# Patient Record
Sex: Male | Born: 1935
Health system: Southern US, Community
[De-identification: ages and names within clinical notes are randomized; demographics above are authoritative.]

## PROBLEM LIST (undated history)

## (undated) DIAGNOSIS — I700 Atherosclerosis of aorta: Secondary | ICD-10-CM

## (undated) DIAGNOSIS — K449 Diaphragmatic hernia without obstruction or gangrene: Secondary | ICD-10-CM

## (undated) DIAGNOSIS — K760 Fatty (change of) liver, not elsewhere classified: Secondary | ICD-10-CM

## (undated) DIAGNOSIS — F329 Major depressive disorder, single episode, unspecified: Secondary | ICD-10-CM

## (undated) DIAGNOSIS — K429 Umbilical hernia without obstruction or gangrene: Secondary | ICD-10-CM

## (undated) DIAGNOSIS — N529 Male erectile dysfunction, unspecified: Secondary | ICD-10-CM

## (undated) DIAGNOSIS — T7840XA Allergy, unspecified, initial encounter: Secondary | ICD-10-CM

## (undated) DIAGNOSIS — I340 Nonrheumatic mitral (valve) insufficiency: Secondary | ICD-10-CM

## (undated) DIAGNOSIS — M549 Dorsalgia, unspecified: Secondary | ICD-10-CM

## (undated) DIAGNOSIS — M1990 Unspecified osteoarthritis, unspecified site: Secondary | ICD-10-CM

## (undated) DIAGNOSIS — K219 Gastro-esophageal reflux disease without esophagitis: Secondary | ICD-10-CM

## (undated) DIAGNOSIS — E119 Type 2 diabetes mellitus without complications: Secondary | ICD-10-CM

## (undated) DIAGNOSIS — M4800 Spinal stenosis, site unspecified: Secondary | ICD-10-CM

## (undated) DIAGNOSIS — I252 Old myocardial infarction: Secondary | ICD-10-CM

## (undated) DIAGNOSIS — I2510 Atherosclerotic heart disease of native coronary artery without angina pectoris: Secondary | ICD-10-CM

## (undated) DIAGNOSIS — F32A Depression, unspecified: Secondary | ICD-10-CM

## (undated) DIAGNOSIS — I517 Cardiomegaly: Secondary | ICD-10-CM

## (undated) DIAGNOSIS — I10 Essential (primary) hypertension: Secondary | ICD-10-CM

## (undated) DIAGNOSIS — G8929 Other chronic pain: Secondary | ICD-10-CM

## (undated) HISTORY — DX: Nonrheumatic mitral (valve) insufficiency: I34.0

## (undated) HISTORY — PX: COLONOSCOPY WITH ESOPHAGOGASTRODUODENOSCOPY (EGD): SHX5779

## (undated) HISTORY — DX: Other chronic pain: G89.29

## (undated) HISTORY — DX: Umbilical hernia without obstruction or gangrene: K42.9

## (undated) HISTORY — DX: Depression, unspecified: F32.A

## (undated) HISTORY — DX: Atherosclerotic heart disease of native coronary artery without angina pectoris: I25.10

## (undated) HISTORY — DX: Old myocardial infarction: I25.2

## (undated) HISTORY — PX: KNEE CARTILAGE SURGERY: SHX688

## (undated) HISTORY — DX: Male erectile dysfunction, unspecified: N52.9

## (undated) HISTORY — DX: Atherosclerosis of aorta: I70.0

## (undated) HISTORY — DX: Gastro-esophageal reflux disease without esophagitis: K21.9

## (undated) HISTORY — DX: Other chronic pain: M54.9

## (undated) HISTORY — PX: CHOLECYSTECTOMY: SHX55

## (undated) HISTORY — DX: Cardiomegaly: I51.7

## (undated) HISTORY — DX: Major depressive disorder, single episode, unspecified: F32.9

## (undated) HISTORY — DX: Spinal stenosis, site unspecified: M48.00

## (undated) HISTORY — PX: ERCP: SHX5425

## (undated) HISTORY — DX: Diaphragmatic hernia without obstruction or gangrene: K44.9

## (undated) HISTORY — DX: Fatty (change of) liver, not elsewhere classified: K76.0

## (undated) HISTORY — DX: Unspecified osteoarthritis, unspecified site: M19.90

## (undated) HISTORY — DX: Allergy, unspecified, initial encounter: T78.40XA

---

## 2008-04-20 ENCOUNTER — Ambulatory Visit (HOSPITAL_COMMUNITY): Admission: RE | Admit: 2008-04-20 | Discharge: 2008-04-21 | Payer: Self-pay | Admitting: Ophthalmology

## 2008-04-20 ENCOUNTER — Encounter (INDEPENDENT_AMBULATORY_CARE_PROVIDER_SITE_OTHER): Payer: Self-pay | Admitting: Ophthalmology

## 2010-09-17 NOTE — Op Note (Signed)
NAME:  Benjamin Ramirez, Benjamin Ramirez                  ACCOUNT NO.:  [id number]   MEDICAL RECORD NO.:  [id number]          PATIENT TYPE:  OIB   LOCATION:  5120                         FACILITY:  [facility]   PHYSICIAN:  Beulah Gandy. Ashley Royalty, M.D. DATE OF BIRTH:  02/04/1935   DATE OF PROCEDURE:  04/20/2008  DATE OF DISCHARGE:                               OPERATIVE REPORT   PROCEDURES:  Pars plana vitrectomy right eye, removal of intraocular  lens from vitreous retinal photocoagulation, placement of secondary  intraocular lens with suture and gas fluid exchange right eye.   SURGEON:  Beulah Gandy. Ashley Royalty, MD   ASSISTANT:  Rosalie Doctor, MA   ANESTHESIA:  General.   DETAILS:  Usual prep and drape, conjunctival peritomy from 8 o'clock  around to 4 o'clock, two scleral flaps were raised at 3 and 9 o'clock in  anticipation of IOL suture.  A posterior scleral wound was placed from  10 o'clock to 2 o'clock.  25-gauge cannulas were placed at 8, 10 and 2  o'clock.  Infusion at 8 o'clock.  A preplaced 9-0 suture was placed in  the corneoscleral wound at 12 o'clock.  Pars plana vitrectomy was begun  just behind the pupillary axis.  Pigment and vitreous was encountered  and this was carefully removed under low suction and rapid cutting.  Intraocular lens came into view.  This was cleaned and the vitreous was  removed from its attachments to the intraocular lens.  The vitrectomy  was carried posteriorly and a core vitrectomy was performed.  Then the  vitrectomy was carried into the far periphery where additional  vitrectomy was carried down to the vitreous base for 360 degrees.  Intraocular lens was then passed into the anterior chamber and the  corneoscleral wound was opened entirely.  The intraocular lens was  removed from the eye and dialed out of the corneal wound.  9-0 preplaced  sutures were placed back in the corneal wound and additional vitrectomy  was completed.  Two Prolene sutures were passed from 3 o'clock to 9  o'clock for IOL suture.  These sutures were externalized through the  corneal wound.  A new intraocular lens model [id number] [employer]. power 20.5 D, length 12.5 MM, optic 7.0 MM, serial number [id number]  007, expiration date December 2013 was brought onto the field.  It was  inspected and cleaned.  The Prolene sutures were attached to the eyelets  of the lens and the lens was passed into the anterior chamber, then into  the posterior chamber.  It was dialed into place.  The Prolene sutures  were drawn securely and knotted beneath the scleral flaps.  Scleral  flaps were allowed to cover the knots.  The corneal wound was closed  with six interrupted 10-0 nylon sutures.  Additional vitrectomy was  performed to remove pigment from the vitreous.  The retina itself was  cleaned and vacuumed with a vitreous cutter.  A 30% gas fluid exchange  was performed.  The instruments were removed from the eye.  The trocars  were removed from the eye.  Closing  pressure was 15 with a [place]  tonometer and the indirect ophthalmoscope laser was moved into place and  two rows of laser were placed around the retinal periphery.  The power  was 2000 milliwatts, 596 burns, 1000 microns each and 0.1 seconds each.  The  conjunctiva was reapproximated with 7-0 chromic suture.  Polymyxin and  gentamicin were irrigated into [place] space.  Decadron 10 mg was injected  into the lower subconjunctival space.  Polysporin ophthalmic ointment,  patch and shield were placed.  The patient was awakened and taken to  recovery in satisfactory condition.      Beulah Gandy. Ashley Royalty, M.D.  Electronically Signed     JDM/MEDQ  D:  04/20/2008  T:  04/21/2008  Job:  [id number]

## 2011-02-07 LAB — GLUCOSE, CAPILLARY
Glucose-Capillary: 107 mg/dL — ABNORMAL HIGH (ref 70–99)
Glucose-Capillary: 168 mg/dL — ABNORMAL HIGH (ref 70–99)
Glucose-Capillary: 173 mg/dL — ABNORMAL HIGH (ref 70–99)
Glucose-Capillary: 175 mg/dL — ABNORMAL HIGH (ref 70–99)

## 2011-02-07 LAB — COMPREHENSIVE METABOLIC PANEL
ALT: 21 U/L (ref 0–53)
Alkaline Phosphatase: 59 U/L (ref 39–117)
BUN: 12 mg/dL (ref 6–23)
CO2: 27 mEq/L (ref 19–32)
Chloride: 103 mEq/L (ref 96–112)
GFR calc non Af Amer: >60 mL/min (ref >60)
Glucose, Bld: 131 mg/dL — ABNORMAL HIGH (ref 70–99)
Potassium: 4.6 mEq/L (ref 3.5–5.1)
Sodium: 138 mEq/L (ref 135–145)
Total Bilirubin: 0.8 mg/dL (ref 0.3–1.2)

## 2011-02-07 LAB — CBC
HCT: 46.3 % (ref 39.0–52.0)
Hemoglobin: 15.5 g/dL (ref 13.0–17.0)
RBC: 5.16 MIL/uL (ref 4.22–5.81)

## 2011-09-22 DIAGNOSIS — Z9889 Other specified postprocedural states: Secondary | ICD-10-CM | POA: Insufficient documentation

## 2011-10-06 DIAGNOSIS — I219 Acute myocardial infarction, unspecified: Secondary | ICD-10-CM | POA: Insufficient documentation

## 2011-10-06 DIAGNOSIS — K449 Diaphragmatic hernia without obstruction or gangrene: Secondary | ICD-10-CM

## 2011-10-06 DIAGNOSIS — K219 Gastro-esophageal reflux disease without esophagitis: Secondary | ICD-10-CM | POA: Insufficient documentation

## 2011-10-06 DIAGNOSIS — J302 Other seasonal allergic rhinitis: Secondary | ICD-10-CM | POA: Insufficient documentation

## 2011-10-06 DIAGNOSIS — J329 Chronic sinusitis, unspecified: Secondary | ICD-10-CM | POA: Insufficient documentation

## 2011-10-06 DIAGNOSIS — N529 Male erectile dysfunction, unspecified: Secondary | ICD-10-CM | POA: Insufficient documentation

## 2011-10-06 DIAGNOSIS — M1990 Unspecified osteoarthritis, unspecified site: Secondary | ICD-10-CM

## 2011-10-06 DIAGNOSIS — M4690 Unspecified inflammatory spondylopathy, site unspecified: Secondary | ICD-10-CM | POA: Insufficient documentation

## 2011-10-06 HISTORY — DX: Diaphragmatic hernia without obstruction or gangrene: K44.9

## 2011-10-06 HISTORY — DX: Male erectile dysfunction, unspecified: N52.9

## 2011-10-06 HISTORY — DX: Unspecified osteoarthritis, unspecified site: M19.90

## 2014-07-26 DIAGNOSIS — E1149 Type 2 diabetes mellitus with other diabetic neurological complication: Secondary | ICD-10-CM | POA: Insufficient documentation

## 2015-02-14 ENCOUNTER — Emergency Department (HOSPITAL_BASED_OUTPATIENT_CLINIC_OR_DEPARTMENT_OTHER)
Admission: EM | Admit: 2015-02-14 | Discharge: 2015-02-14 | Disposition: A | Discharge status: Home / Self Care | Payer: Medicare (Managed Care) | Attending: Emergency Medicine | Admitting: Emergency Medicine

## 2015-02-14 ENCOUNTER — Emergency Department (HOSPITAL_BASED_OUTPATIENT_CLINIC_OR_DEPARTMENT_OTHER): Payer: Medicare (Managed Care)

## 2015-02-14 ENCOUNTER — Encounter (HOSPITAL_BASED_OUTPATIENT_CLINIC_OR_DEPARTMENT_OTHER): Payer: Self-pay | Admitting: *Deleted

## 2015-02-14 DIAGNOSIS — M25562 Pain in left knee: Secondary | ICD-10-CM | POA: Diagnosis present

## 2015-02-14 DIAGNOSIS — M1712 Unilateral primary osteoarthritis, left knee: Secondary | ICD-10-CM

## 2015-02-14 DIAGNOSIS — M25462 Effusion, left knee: Secondary | ICD-10-CM | POA: Insufficient documentation

## 2015-02-14 DIAGNOSIS — Z7984 Long term (current) use of oral hypoglycemic drugs: Secondary | ICD-10-CM | POA: Diagnosis not present

## 2015-02-14 DIAGNOSIS — E119 Type 2 diabetes mellitus without complications: Secondary | ICD-10-CM | POA: Insufficient documentation

## 2015-02-14 DIAGNOSIS — I10 Essential (primary) hypertension: Secondary | ICD-10-CM | POA: Insufficient documentation

## 2015-02-14 DIAGNOSIS — Z79899 Other long term (current) drug therapy: Secondary | ICD-10-CM | POA: Insufficient documentation

## 2015-02-14 HISTORY — DX: Type 2 diabetes mellitus without complications: E11.9

## 2015-02-14 HISTORY — DX: Essential (primary) hypertension: I10

## 2015-02-14 LAB — CBC WITH DIFFERENTIAL/PLATELET
BASOS PCT: 1 %
Basophils Absolute: 0 10*3/uL (ref 0.0–0.1)
Eosinophils Absolute: 0 10*3/uL (ref 0.0–0.7)
Eosinophils Relative: 1 %
HEMATOCRIT: 46.5 % (ref 39.0–52.0)
HEMOGLOBIN: 16.2 g/dL (ref 13.0–17.0)
LYMPHS PCT: 11 %
Lymphs Abs: 0.9 10*3/uL (ref 0.7–4.0)
MCH: 30.1 pg (ref 26.0–34.0)
MCHC: 34.8 g/dL (ref 30.0–36.0)
MCV: 86.4 fL (ref 78.0–100.0)
MONO ABS: 0.4 10*3/uL (ref 0.1–1.0)
MONOS PCT: 5 %
NEUTROS ABS: 6.3 10*3/uL (ref 1.7–7.7)
Neutrophils Relative %: 82 %
Platelets: 183 10*3/uL (ref 150–400)
RBC: 5.38 MIL/uL (ref 4.22–5.81)
RDW: 13.7 % (ref 11.5–15.5)
WBC: 7.6 10*3/uL (ref 4.0–10.5)

## 2015-02-14 LAB — CBG MONITORING, ED: Glucose-Capillary: 107 mg/dL — ABNORMAL HIGH (ref 65–99)

## 2015-02-14 LAB — BASIC METABOLIC PANEL
ANION GAP: 8 (ref 5–15)
BUN: 14 mg/dL (ref 6–20)
CHLORIDE: 106 mmol/L (ref 101–111)
CO2: 24 mmol/L (ref 22–32)
CREATININE: 0.92 mg/dL (ref 0.61–1.24)
Calcium: 9.4 mg/dL (ref 8.9–10.3)
GFR calc non Af Amer: >60 mL/min (ref >60)
GLUCOSE: 120 mg/dL — AB (ref 65–99)
Potassium: 3.6 mmol/L (ref 3.5–5.1)
Sodium: 138 mmol/L (ref 135–145)

## 2015-02-14 MED ORDER — HYDROMORPHONE HCL 1 MG/ML IJ SOLN
1.0000 mg | Freq: Once | INTRAMUSCULAR | Status: AC
  Administered 2015-02-14: 1 mg via INTRAVENOUS
  Filled 2015-02-14: qty 1

## 2015-02-14 NOTE — ED Notes (Signed)
Pt arrived via [facility] with c/o left knee swelling today. Pt had surgury for torn cartilage in 1984. No recent injury. Pt took oxycodone 30 mg prior to EMS arrival. Swelling noted to left knee. Ice pack applied.

## 2015-02-14 NOTE — ED Notes (Signed)
Pt states he has friends at home to assist with him getting into the house. His girlfriend will drive him.

## 2015-02-14 NOTE — ED Provider Notes (Signed)
CSN: [id number]     Arrival date & time 02/14/15  1453 History   First MD Initiated Contact with Patient 02/14/15 1527     No chief complaint on file.    (Consider location/radiation/quality/duration/timing/severity/associated sxs/prior Treatment) The history is provided by the patient.  Benjamin Ramirez is a 80 y.o. male hx of DM, HTN arthritis here presenting with left knee swelling and pain. Patient states that his left knee has been intermittently swollen but worse over the last week or so. He had a meniscus repair in 1984 by a doctor in [place]. States that his orthopedic doctor has retired. Patient has been taking oxycodone as prescribed by his doctor for pain. Denies any fevers or chills. States that he has some pain when he puts weight on it. Denies any history of gout.   Past Medical History  Diagnosis Date  . Diabetes mellitus without complication (HCC)   . Hypertension    History reviewed. No pertinent past surgical history. No family history on file. Social History  Substance Use Topics  . Smoking status: Never Smoker   . Smokeless tobacco: None  . Alcohol Use: None    Review of Systems  Musculoskeletal:       L knee pain   All other systems reviewed and are negative.     Allergies  Novocain  Home Medications   Prior to Admission medications   Medication Sig Start Date End Date Taking? Authorizing Provider  amLODipine (NORVASC) 10 MG tablet Take 10 mg by mouth daily.   Yes Historical Provider, MD  metFORMIN (GLUCOPHAGE) 500 MG tablet Take by mouth 2 (two) times daily with a meal.   Yes Historical Provider, MD  oxyCODONE (ROXICODONE) 15 MG immediate release tablet Take 15 mg by mouth every 4 (four) hours as needed for pain.   Yes Historical Provider, MD   BP 147/71 mmHg  Pulse 60  Temp(Src) 98.4 F (36.9 C) (Oral)  Resp 18  SpO2 100% Physical Exam  Constitutional: He is oriented to person, place, and time.  Uncomfortable   HENT:  Head: Normocephalic.   Eyes: Pupils are equal, round, and reactive to light.  Neck: Normal range of motion.  Cardiovascular: Normal rate.   Pulmonary/Chest: Effort normal.  Abdominal: Soft. Bowel sounds are normal.  Musculoskeletal:  L knee with obvious effusion, dec ROM. Knee is not warm or red. 2+ pulses.   Neurological: He is alert and oriented to person, place, and time.  Skin: Skin is warm.  Psychiatric: He has a normal mood and affect. His behavior is normal. Judgment and thought content normal.  Nursing note and vitals reviewed.   ED Course  Procedures (including critical care time) Labs Review Labs Reviewed  BASIC METABOLIC PANEL - Abnormal; Notable for the following:    Glucose, Bld 120 (*)    All other components within normal limits  CBG MONITORING, ED - Abnormal; Notable for the following:    Glucose-Capillary 107 (*)    All other components within normal limits  CBC WITH DIFFERENTIAL/PLATELET    Imaging Review Dg Knee Complete 4 Views Left  02/14/2015  CLINICAL DATA:  Sudden onset of left knee pain, redness and swelling. EXAM: LEFT KNEE - COMPLETE 4+ VIEW COMPARISON:  None. FINDINGS: There is a large joint effusion. There is severe tricompartmental osteoarthritis. Degenerative calcifications in the distal quadriceps tendon and proximal patellar tendon. Loose bodies in the joint. No discrete fracture. IMPRESSION: Tricompartmental osteoarthritis.  Large joint effusion. Electronically Signed   By: Fayrene Fearing  Maxwell M.D.   On: 02/14/2015 16:40   I have personally reviewed and evaluated these images and lab results as part of my medical decision-making.   EKG Interpretation None      MDM   Final diagnoses:  None   Benjamin Ramirez is a 80 y.o. male here with L knee effusion. Xray showed arthritis. Effusion likely reactive from the arthritis. No hx of gout. No signs of septic knee. Has pain meds at home. Given dilaudid with some relief. Given knee immobilizer and crutches. Recommend ortho f/u for  possible steroid injections vs knee replacement.     Richardean Canal, MD 02/14/15 [phone]

## 2015-02-14 NOTE — ED Notes (Signed)
Talked with Dr Silverio Lay about giving pain med prior to discharge.  Dr Silverio Lay states okay to discharge pt after pain med.

## 2015-02-14 NOTE — Discharge Instructions (Signed)
Continue oxycodone as prescribed by your doctor.   Keep leg elevated. Use crutches for support.  See orthopedic doctor. You have bad arthritis and should consider steroid injections or knee replacement.   Return to ER if you have worse pain or swelling, fevers.

## 2017-05-29 ENCOUNTER — Ambulatory Visit (INDEPENDENT_AMBULATORY_CARE_PROVIDER_SITE_OTHER): Payer: Medicare HMO | Admitting: Physician Assistant

## 2017-05-29 ENCOUNTER — Encounter: Payer: Self-pay | Admitting: Physician Assistant

## 2017-05-29 VITALS — BP 121/53 | HR 61 | Wt 156.0 lb

## 2017-05-29 DIAGNOSIS — M545 Low back pain, unspecified: Secondary | ICD-10-CM

## 2017-05-29 DIAGNOSIS — Z79899 Other long term (current) drug therapy: Secondary | ICD-10-CM

## 2017-05-29 DIAGNOSIS — Z130 Encounter for screening for diseases of the blood and blood-forming organs and certain disorders involving the immune mechanism: Secondary | ICD-10-CM

## 2017-05-29 DIAGNOSIS — Z13220 Encounter for screening for lipoid disorders: Secondary | ICD-10-CM

## 2017-05-29 DIAGNOSIS — E1159 Type 2 diabetes mellitus with other circulatory complications: Secondary | ICD-10-CM | POA: Insufficient documentation

## 2017-05-29 DIAGNOSIS — K5903 Drug induced constipation: Secondary | ICD-10-CM | POA: Insufficient documentation

## 2017-05-29 DIAGNOSIS — E1140 Type 2 diabetes mellitus with diabetic neuropathy, unspecified: Secondary | ICD-10-CM

## 2017-05-29 DIAGNOSIS — Z23 Encounter for immunization: Secondary | ICD-10-CM | POA: Diagnosis not present

## 2017-05-29 DIAGNOSIS — Z7689 Persons encountering health services in other specified circumstances: Secondary | ICD-10-CM | POA: Diagnosis not present

## 2017-05-29 DIAGNOSIS — M4800 Spinal stenosis, site unspecified: Secondary | ICD-10-CM

## 2017-05-29 DIAGNOSIS — G8929 Other chronic pain: Secondary | ICD-10-CM | POA: Insufficient documentation

## 2017-05-29 DIAGNOSIS — M48062 Spinal stenosis, lumbar region with neurogenic claudication: Secondary | ICD-10-CM | POA: Insufficient documentation

## 2017-05-29 DIAGNOSIS — I252 Old myocardial infarction: Secondary | ICD-10-CM | POA: Insufficient documentation

## 2017-05-29 DIAGNOSIS — I10 Essential (primary) hypertension: Secondary | ICD-10-CM | POA: Diagnosis not present

## 2017-05-29 DIAGNOSIS — T402X5A Adverse effect of other opioids, initial encounter: Secondary | ICD-10-CM | POA: Insufficient documentation

## 2017-05-29 DIAGNOSIS — G4701 Insomnia due to medical condition: Secondary | ICD-10-CM

## 2017-05-29 DIAGNOSIS — I152 Hypertension secondary to endocrine disorders: Secondary | ICD-10-CM | POA: Insufficient documentation

## 2017-05-29 MED ORDER — LISINOPRIL 10 MG PO TABS: 10.0000 mg | ORAL_TABLET | Freq: Every day | ORAL | 3 refills | Status: DC

## 2017-05-29 MED ORDER — DULOXETINE HCL 30 MG PO CPEP
30.0000 mg | ORAL_CAPSULE | Freq: Every day | ORAL | 3 refills | Status: DC
Start: 2017-05-29 — End: 2017-07-07

## 2017-05-29 MED ORDER — OXYCODONE-ACETAMINOPHEN 7.5-325 MG PO TABS: 1.0000 | ORAL_TABLET | Freq: Four times a day (QID) | ORAL | 0 refills | Status: DC | PRN

## 2017-05-29 MED ORDER — SENNOSIDES-DOCUSATE SODIUM 8.6-50 MG PO TABS: 2.0000 | ORAL_TABLET | Freq: Two times a day (BID) | ORAL | 0 refills | Status: DC

## 2017-05-29 NOTE — Progress Notes (Signed)
HPI:                                                                Benjamin Ramirez is a 83 y.o. male who presents to [facility] today to establish care  Current concerns include: chronic pain  This is a pleasant 83 yo M with PMH of severe spinal stenosis, multi-level DDD, chronic back pain, who presents for chronic pain management. He has been on opioid therapy in the past, but broke his pain contract (wife was giving him her Hydrocodone). He has consulted with a neurosurgeon in the past and is a candidate for spine surgery, but would like to avoid surgery at all costs. He also has CVD risks that would make surgery more risky.  Prior treatments: Amitriptyline, Gabapentin, Lyrica, NSAIDs, PT, Norco Analgesia: average pain level is 10/10, severe; no pain relief from OTC ADL's: rates physical functioning, mood, sleep and overall function as poor Adverse reactions: constipation, no hx of overdose or AMS Aberrant drug-related behaviors: hx of violating pain contract Affect: depressed  Opioid Risk Tool - 05/29/17 1500      Family History of Substance Abuse   Alcohol  Positive Male    Illegal Drugs  Negative    Rx Drugs  Negative      Personal History of Substance Abuse   Alcohol  Negative    Illegal Drugs  Negative    Rx Drugs  Negative      Age   Age between 80-45 years   No      History of Preadolescent Sexual Abuse   History of Preadolescent Sexual Abuse  Negative or Male      Psychological Disease   Psychological Disease  Positive    Depression  Positive      Total Score   Opioid Risk Tool Scoring  6    Opioid Risk Interpretation  Moderate Risk        Depression screen PHQ 2/9 05/29/2017  Decreased Interest 1  Down, Depressed, Hopeless 0  PHQ - 2 Score 1  Altered sleeping 3  Tired, decreased energy 2  Change in appetite 1  Feeling bad or failure about yourself  1  Trouble concentrating 1  Moving slowly or  fidgety/restless 1  Suicidal thoughts 0  PHQ-9 Score 10    GAD 7 : Generalized Anxiety Score 05/29/2017  Nervous, Anxious, on Edge 3  Control/stop worrying 3  Worry too much - different things 3  Trouble relaxing 3  Restless 3  Easily annoyed or irritable 3  Afraid - awful might happen 3  Total GAD 7 Score 21    Dr. Vira Blanco and Dr. Lilia Pro  Past Medical History:  Diagnosis Date  . Allergy   . Atherosclerosis of aorta ([place])   . CAD (coronary artery disease)   . Cardiomegaly   . Chronic back pain   . Depression   . Diabetes mellitus without complication ([place])   . Erectile dysfunction 10/06/2011  . Fatty liver   . GERD (gastroesophageal reflux disease)   . Hiatal hernia 10/06/2011  . History of myocardial infarction   . Hypertension   . Osteoarthritis 10/06/2011  . Spinal stenosis   . Umbilical hernia without obstruction or gangrene  small, mesenteric   Past Surgical History:  Procedure Laterality Date  . CHOLECYSTECTOMY    . COLONOSCOPY WITH ESOPHAGOGASTRODUODENOSCOPY (EGD)    . ERCP    . KNEE CARTILAGE SURGERY Bilateral    Social History   Tobacco Use  . Smoking status: Current Some Day Smoker    Types: Cigarettes  . Smokeless tobacco: Never Used  . Tobacco comment: 7 cigarettes per week  Substance Use Topics  . Alcohol use: No    Frequency: Never   family history is not on file.    ROS: Review of Systems  HENT: Positive for hearing loss and tinnitus.   Eyes: Positive for blurred vision.  Gastrointestinal: Positive for blood in stool.  Musculoskeletal: Positive for back pain and myalgias.  Neurological: Positive for headaches.  Endo/Heme/Allergies: Positive for environmental allergies.  Psychiatric/Behavioral: Positive for depression. The patient is nervous/anxious and has insomnia.      Medications: Current Outpatient Medications  Medication Sig Dispense Refill  . Blood Glucose Monitoring Suppl (PRODIGY AUTOCODE BLOOD GLUCOSE) w/Device KIT Use to  check glucose three times daily if needed for hyperglycemic symptoms and/or hypoglycemic signs.    Marland Kitchen PRODIGY LANCETS 28G MISC Use to check glucose once to twice daily PRN for hyperglycemia    . Alpha-Lipoic Acid 600 MG CAPS Take 1 capsule (600 mg total) by mouth daily. 30 each 11  . aspirin EC 81 MG tablet Take by mouth.    Marland Kitchen atorvastatin (LIPITOR) 10 MG tablet Take 0.5 tablets (5 mg total) by mouth daily. 45 tablet 3  . DULoxetine (CYMBALTA) 30 MG capsule Take 1 capsule (30 mg total) by mouth daily. 30 capsule 3  . lisinopril (PRINIVIL,ZESTRIL) 10 MG tablet Take 1 tablet (10 mg total) by mouth daily. 30 tablet 3  . oxyCODONE-acetaminophen (PERCOCET) 7.5-325 MG tablet Take 1 tablet by mouth every 6 (six) hours as needed for severe pain. 120 tablet 0  . senna-docusate (SENOKOT-S) 8.6-50 MG tablet Take 2 tablets by mouth 2 (two) times daily. Until stooling regularly 60 tablet 0   No current facility-administered medications for this visit.    Allergies  Allergen Reactions  . Cephalexin   . Clarithromycin     Nausea and vomiting  . Lyrica [Pregabalin] Other (See Comments)    "made me a zombie"  . Novocain [Procaine] Itching       Objective:  BP (!) 121/53   Pulse 61   Wt 156 lb (70.8 kg)  Gen:  alert, not ill-appearing, no distress, appropriate for age HEENT: head normocephalic without obvious abnormality, conjunctiva and cornea clear, trachea midline Pulm: Normal work of breathing, normal phonation, clear to auscultation bilaterally, no wheezes, rales or rhonchi CV: Normal rate, regular rhythm, s1 and s2 distinct, no murmurs, clicks or rubs  Neuro: alert and oriented x 3, no tremor MSK: extremities atraumatic, antalgic gait and station Skin: intact, no rashes on exposed skin, no jaundice, no cyanosis Psych: well-groomed, cooperative, good eye contact, depressed mood, affect mood-congruent, speech is articulate, and thought processes clear and goal-directed    No results found  for this or any previous visit (from the past 72 hour(s)). No results found.    Assessment and Plan: 83 y.o. male with   1. Encounter to establish care - reviewed PMH, PSH, PFH, medications and allergies - reviewed health maintenance - influenza given today - pneumonia vaccines UTD   2. Hypertension associated with diabetes ([place]) - switching from Amlodipine to ACE for renal protection - lisinopril (PRINIVIL,ZESTRIL) 10 MG  tablet; Take 1 tablet (10 mg total) by mouth daily.  Dispense: 30 tablet; Refill: 3  3. Controlled type 2 diabetes mellitus with diabetic neuropathy, without long-term current use of insulin (HCC) - Hemoglobin A1c - Lipid Panel w/reflex Direct LDL - CBC - COMPLETE METABOLIC PANEL WITH GFR  4. Insomnia secondary to chronic pain   5. Spinal stenosis, unspecified spinal region - DULoxetine (CYMBALTA) 30 MG capsule; Take 1 capsule (30 mg total) by mouth daily.  Dispense: 30 capsule; Refill: 3 - oxyCODONE-acetaminophen (PERCOCET) 7.5-325 MG tablet; Take 1 tablet by mouth every 6 (six) hours as needed for severe pain.  Dispense: 120 tablet; Refill: 0  6. Screening for lipid disorders - Lipid Panel w/reflex Direct LDL  7. Screening for blood disease - CBC - COMPLETE METABOLIC PANEL WITH GFR  8. Medication management - Hemoglobin A1c - Lipid Panel w/reflex Direct LDL - CBC - COMPLETE METABOLIC PANEL WITH GFR  9. Encounter for chronic pain management - DULoxetine (CYMBALTA) 30 MG capsule; Take 1 capsule (30 mg total) by mouth daily.  Dispense: 30 capsule; Refill: 3 - oxyCODONE-acetaminophen (PERCOCET) 7.5-325 MG tablet; Take 1 tablet by mouth every 6 (six) hours as needed for severe pain.  Dispense: 120 tablet; Refill: 0 - Drugs of abuse screen w/o alc (for [facility] [facility])  10. Chronic bilateral low back pain without sciatica - I am discontinuing his Amitriptyline and Gabapentin due to risk of over-sedation and overdose. Starting Cymbalta in addition to Percocet -  moderate risk of chronic opioid therapy Indication for chronic opioid: spinal stenosis, relative contraindication to NSAID (heart disease), failure of alternative treatments Medication and dose: Percocet 7.5-325 Q6H # pills per month: 120 Last UDS date: today, pending Pain contract signed (Y/N): Y Date narcotic database last reviewed (include red flags): today, no red flags - DULoxetine (CYMBALTA) 30 MG capsule; Take 1 capsule (30 mg total) by mouth daily.  Dispense: 30 capsule; Refill: 3 - oxyCODONE-acetaminophen (PERCOCET) 7.5-325 MG tablet; Take 1 tablet by mouth every 6 (six) hours as needed for severe pain.  Dispense: 120 tablet; Refill: 0  11. Therapeutic opioid induced constipation - senna-docusate (SENOKOT-S) 8.6-50 MG tablet; Take 2 tablets by mouth 2 (two) times daily. Until stooling regularly  Dispense: 60 tablet; Refill: 0  12. Needs flu shot - Flu vaccine HIGH DOSE PF (Fluzone High dose)     Patient education and anticipatory guidance given Patient agrees with treatment plan Follow-up as needed if symptoms worsen or fail to improve  Darlyne Russian PA-C

## 2017-05-29 NOTE — Patient Instructions (Addendum)
-   You will need to be seen in the office every 3 months for refills of your pain medication - If prescription is lost or stolen, you will not receive an early refill - Store your prescription in a safe place. Recommend a medication lock box - Never share your prescription with anyone else

## 2017-05-30 LAB — CBC
HEMATOCRIT: 45.6 % (ref 38.5–50.0)
HEMOGLOBIN: 16 g/dL (ref 13.2–17.1)
MCH: 31.7 pg (ref 27.0–33.0)
MCHC: 35.1 g/dL (ref 32.0–36.0)
MCV: 90.5 fL (ref 80.0–100.0)
MPV: 9.5 fL (ref 7.5–12.5)
Platelets: 187 10*3/uL (ref 140–400)
RBC: 5.04 10*6/uL (ref 4.20–5.80)
RDW: 12.5 % (ref 11.0–15.0)
WBC: 5.5 10*3/uL (ref 3.8–10.8)

## 2017-05-30 LAB — COMPLETE METABOLIC PANEL WITH GFR
AG Ratio: 1.6 (calc) (ref 1.0–2.5)
ALBUMIN MSPROF: 4.2 g/dL (ref 3.6–5.1)
ALKALINE PHOSPHATASE (APISO): 51 U/L (ref 40–115)
ALT: 17 U/L (ref 9–46)
AST: 23 U/L (ref 10–35)
BILIRUBIN TOTAL: 0.8 mg/dL (ref 0.2–1.2)
BUN / CREAT RATIO: 15 (calc) (ref 6–22)
BUN: 17 mg/dL (ref 7–25)
CO2: 30 mmol/L (ref 20–32)
CREATININE: 1.13 mg/dL — AB (ref 0.70–1.11)
Calcium: 9.5 mg/dL (ref 8.6–10.3)
Chloride: 108 mmol/L (ref 98–110)
GFR, Est African American: 71 mL/min/{1.73_m2} (ref >=60)
GFR, Est Non African American: 61 mL/min/{1.73_m2} (ref >=60)
GLOBULIN: 2.7 g/dL (ref 1.9–3.7)
Glucose, Bld: 97 mg/dL (ref 65–99)
Potassium: 4.9 mmol/L (ref 3.5–5.3)
Sodium: 143 mmol/L (ref 135–146)
Total Protein: 6.9 g/dL (ref 6.1–8.1)

## 2017-05-30 LAB — LIPID PANEL W/REFLEX DIRECT LDL
Cholesterol: 162 mg/dL (ref <200)
HDL: 58 mg/dL (ref >40)
LDL CHOLESTEROL (CALC): 85 mg/dL
Non-HDL Cholesterol (Calc): 104 mg/dL (calc) (ref <130)
TRIGLYCERIDES: 97 mg/dL (ref <150)
Total CHOL/HDL Ratio: 2.8 (calc) (ref <5.0)

## 2017-05-30 LAB — HEMOGLOBIN A1C
HEMOGLOBIN A1C: 5.2 %{Hb} (ref <5.7)
Mean Plasma Glucose: 103 (calc)
eAG (mmol/L): 5.7 (calc)

## 2017-05-31 LAB — DRUGS OF ABUSE SCREEN W/O ALC, ROUTINE URINE
AMPHETAMINES (1000 NG/ML SCRN): NEGATIVE
BARBITURATES: NEGATIVE
BENZODIAZEPINES: NEGATIVE
COCAINE METABOLITES: NEGATIVE
MARIJUANA MET (50 ng/mL SCRN): NEGATIVE
METHADONE: NEGATIVE
METHAQUALONE: NEGATIVE
OPIATES: NEGATIVE
PHENCYCLIDINE: NEGATIVE
PROPOXYPHENE: NEGATIVE

## 2017-06-01 ENCOUNTER — Other Ambulatory Visit: Payer: Self-pay | Admitting: Physician Assistant

## 2017-06-01 NOTE — Progress Notes (Signed)
Labs look great A1c is normal, which means diabetes is well-controlled.  There is no need for Metformin. We just need to monitor A1c every 6 months.

## 2017-06-05 ENCOUNTER — Encounter: Payer: Self-pay | Admitting: Physician Assistant

## 2017-06-05 ENCOUNTER — Ambulatory Visit (INDEPENDENT_AMBULATORY_CARE_PROVIDER_SITE_OTHER): Payer: Medicare HMO | Admitting: Physician Assistant

## 2017-06-05 VITALS — BP 143/69 | HR 65 | Resp 18 | Ht 65.5 in | Wt 156.0 lb

## 2017-06-05 DIAGNOSIS — Z0100 Encounter for examination of eyes and vision without abnormal findings: Secondary | ICD-10-CM

## 2017-06-05 DIAGNOSIS — G629 Polyneuropathy, unspecified: Secondary | ICD-10-CM | POA: Diagnosis not present

## 2017-06-05 DIAGNOSIS — I872 Venous insufficiency (chronic) (peripheral): Secondary | ICD-10-CM | POA: Diagnosis not present

## 2017-06-05 DIAGNOSIS — H9193 Unspecified hearing loss, bilateral: Secondary | ICD-10-CM

## 2017-06-05 DIAGNOSIS — B351 Tinea unguium: Secondary | ICD-10-CM | POA: Diagnosis not present

## 2017-06-05 DIAGNOSIS — I10 Essential (primary) hypertension: Secondary | ICD-10-CM

## 2017-06-05 DIAGNOSIS — I700 Atherosclerosis of aorta: Secondary | ICD-10-CM | POA: Insufficient documentation

## 2017-06-05 DIAGNOSIS — K429 Umbilical hernia without obstruction or gangrene: Secondary | ICD-10-CM | POA: Insufficient documentation

## 2017-06-05 DIAGNOSIS — M6258 Muscle wasting and atrophy, not elsewhere classified, other site: Secondary | ICD-10-CM

## 2017-06-05 DIAGNOSIS — E119 Type 2 diabetes mellitus without complications: Secondary | ICD-10-CM | POA: Diagnosis not present

## 2017-06-05 DIAGNOSIS — Z9289 Personal history of other medical treatment: Secondary | ICD-10-CM | POA: Insufficient documentation

## 2017-06-05 DIAGNOSIS — Z0289 Encounter for other administrative examinations: Secondary | ICD-10-CM | POA: Insufficient documentation

## 2017-06-05 DIAGNOSIS — K760 Fatty (change of) liver, not elsewhere classified: Secondary | ICD-10-CM | POA: Insufficient documentation

## 2017-06-05 DIAGNOSIS — I517 Cardiomegaly: Secondary | ICD-10-CM | POA: Insufficient documentation

## 2017-06-05 MED ORDER — ATORVASTATIN CALCIUM 10 MG PO TABS: 5.0000 mg | ORAL_TABLET | Freq: Every day | ORAL | 3 refills | Status: DC

## 2017-06-05 MED ORDER — ALPHA-LIPOIC ACID 600 MG PO CAPS: 1.0000 | ORAL_CAPSULE | Freq: Every day | ORAL | 11 refills | Status: DC

## 2017-06-05 NOTE — Progress Notes (Signed)
HPI:                                                                Benjamin Ramirez is a 83 y.o. male who presents to [place]: [facility] today for diabetes follow-up  DMII: managed with diet.  Lab Results  Component Value Date   HGBA1C 5.2 05/29/2017  Wife checks blood sugars at home. Blood sugar range 80's-low 100's. Denies vision change or blurred vision. Denies hypoglycemic events. Denies polyuria, polydipsia, polyphagia. Denies ulcers/wounds on feet. There is a healing ulcer on his left shin from a small trauma (dropping a plate on leg). Eye exam: overdue Foot exam: due  Neuropathy: patient reports long-standing history of peripheral neuropathy. He has known severe spinal stenosis. He reports numbness and paresthesias on the dorsal aspect of both feet, and dorsal aspect of left hand. Denies leg/foot pain, paresis, falls. He has been on Lyrica, Amitriptyline and Gabapentin int he past. Patient reports his back pain is significantly improved since starting Cymbalta and Oxycodone-Acetaminophen. He is sleeping 6-8 hours nightly and feels quality of life is much better.  HTN: switched from Amlodipine to Lisinopril for renal protection. Compliant with medications. Checks BP's at home. BP range 130's-140's/70's. Denies vision change, headache, chest pain with exertion, orthopnea, lightheadedness, syncope and edema.   He is requesting a referral for a hearing test. He has bilateral hearing difficulties, gradually worsening over the last year. Wife states it is affecting his ability to hold a conversation with her.  Depression screen PHQ 2/9 05/29/2017  Decreased Interest 1  Down, Depressed, Hopeless 0  PHQ - 2 Score 1  Altered sleeping 3  Tired, decreased energy 2  Change in appetite 1  Feeling bad or failure about yourself  1  Trouble concentrating 1  Moving slowly or fidgety/restless 1  Suicidal thoughts 0  PHQ-9 Score 10    GAD 7 : Generalized  Anxiety Score 05/29/2017  Nervous, Anxious, on Edge 3  Control/stop worrying 3  Worry too much - different things 3  Trouble relaxing 3  Restless 3  Easily annoyed or irritable 3  Afraid - awful might happen 3  Total GAD 7 Score 21      Past Medical History:  Diagnosis Date  . Allergy   . Atherosclerosis of aorta ([place])   . CAD (coronary artery disease)   . Cardiomegaly   . Chronic back pain   . Depression   . Diabetes mellitus without complication ([place])   . Erectile dysfunction 10/06/2011  . Fatty liver   . GERD (gastroesophageal reflux disease)   . Hiatal hernia 10/06/2011  . History of myocardial infarction   . Hypertension   . Osteoarthritis 10/06/2011  . Spinal stenosis   . Umbilical hernia without obstruction or gangrene    small, mesenteric   Past Surgical History:  Procedure Laterality Date  . CHOLECYSTECTOMY    . COLONOSCOPY WITH ESOPHAGOGASTRODUODENOSCOPY (EGD)    . ERCP    . KNEE CARTILAGE SURGERY Bilateral    Social History   Tobacco Use  . Smoking status: Current Some Day Smoker    Types: Cigarettes  . Smokeless tobacco: Never Used  . Tobacco comment: 7 cigarettes per week  Substance Use Topics  . Alcohol use:  No    Frequency: Never   family history is not on file.    ROS: negative except as noted in the HPI  Medications: Current Outpatient Medications  Medication Sig Dispense Refill  . aspirin EC 81 MG tablet Take by mouth.    . Blood Glucose Monitoring Suppl (PRODIGY AUTOCODE BLOOD GLUCOSE) w/Device KIT Use to check glucose three times daily if needed for hyperglycemic symptoms and/or hypoglycemic signs.    . DULoxetine (CYMBALTA) 30 MG capsule Take 1 capsule (30 mg total) by mouth daily. 30 capsule 3  . lisinopril (PRINIVIL,ZESTRIL) 10 MG tablet Take 1 tablet (10 mg total) by mouth daily. 30 tablet 3  . oxyCODONE-acetaminophen (PERCOCET) 7.5-325 MG tablet Take 1 tablet by mouth every 6 (six) hours as needed for severe pain. 120 tablet 0  .  PRODIGY LANCETS 28G MISC Use to check glucose once to twice daily PRN for hyperglycemia    . senna-docusate (SENOKOT-S) 8.6-50 MG tablet Take 2 tablets by mouth 2 (two) times daily. Until stooling regularly 60 tablet 0  . Alpha-Lipoic Acid 600 MG CAPS Take 1 capsule (600 mg total) by mouth daily. 30 each 11  . atorvastatin (LIPITOR) 10 MG tablet Take 0.5 tablets (5 mg total) by mouth daily. 45 tablet 3   No current facility-administered medications for this visit.    Allergies  Allergen Reactions  . Cephalexin   . Clarithromycin     Nausea and vomiting  . Lyrica [Pregabalin] Other (See Comments)    "made me a zombie"  . Novocain [Procaine] Itching       Objective:  BP (!) 143/69   Pulse 65   Resp 18   Ht 5' 5.5" (1.664 m)   Wt 156 lb (70.8 kg)   BMI 25.56 kg/m  Gen:  alert, not ill-appearing, no distress, appropriate for age 38: head normocephalic without obvious abnormality, conjunctiva and cornea clear, trachea midline Pulm: Normal work of breathing, normal phonation Neuro: alert and oriented x 3, no tremor MSK: extremities atraumatic, well-healed longitudinal scars on bilateral knees, right lower extremity atrophied compared to left, no calf tenderness, DP and PT pulses intact Skin: warm, dry, intact; bilateral distal lower extremities with hyperpigmented skin, left anterior tibia with approx 1cm area of healing eschar, no drainage, surrounding erythema, induration; onychomycosis of bilateral toenails Psych: well-groomed, cooperative, good eye contact, euthymic mood, affect mood-congruent, speech is articulate, and thought processes clear and goal-directed  Diabetic Foot Exam - Simple   Simple Foot Form Diabetic Foot exam was performed with the following findings:  Yes 06/05/2017  3:23 PM  Visual Inspection No deformities, no ulcerations, no other skin breakdown bilaterally:  Yes See comments:  Yes Sensation Testing See comments:  Yes Pulse Check Posterior Tibialis  and Dorsalis pulse intact bilaterally:  Yes Comments Onychomycosis Bilateral heels with decreased sensation      No results found for this or any previous visit (from the past 72 hour(s)). No results found.    Assessment and Plan: 83 y.o. male with   1. Peripheral polyneuropathy - plan to continue Cymbalta, control diabetes, smoking cessation and adding alpha-lipoic acid. I am going to avoid Gabapentin due to chronic opioid therapy. I am going to avoid Amitriptyline due to age/CNS depression. - Alpha-Lipoic Acid 600 MG CAPS; Take 1 capsule (600 mg total) by mouth daily.  Dispense: 30 each; Refill: 11 - Nerve conduction test; Future - follow-up with Sports Medicine  2. Diet-controlled diabetes mellitus [facility]) Lab Results  Component Value Date  HGBA1C 5.2 05/29/2017  - immunizations UTD - foot exam performed today. Counseled on diabetic foot care - on ACE - starting low-dose statin - follow-up every 3 months - atorvastatin (LIPITOR) 10 MG tablet; Take 0.5 tablets (5 mg total) by mouth daily.  Dispense: 45 tablet; Refill: 3  3. Hearing difficulty of both ears - Ambulatory referral to Audiology  4. Diabetic eye exam [facility]) - Ambulatory referral to Ophthalmology  5. Aortic atherosclerosis (HCC) - atorvastatin (LIPITOR) 10 MG tablet; Take 0.5 tablets (5 mg total) by mouth daily.  Dispense: 45 tablet; Refill: 3  6. Hypertension goal BP (blood pressure) < 140/90 BP Readings from Last 3 Encounters:  06/05/17 (!) 143/69  05/29/17 (!) 121/53  37/48/27 078/67  - systolic BP mildly elevated. His goal is <140/90. Continue Lisinopril 10 mg daily    7. Venous stasis dermatitis of both lower extremities   8. Muscle atrophy of lower extremity - Nerve conduction test; Future   Patient education and anticipatory guidance given Patient agrees with treatment plan Follow-up in 3 months for medication management as needed if symptoms worsen or fail to improve  Darlyne Russian  PA-C

## 2017-06-05 NOTE — Patient Instructions (Addendum)
Neuropathic Pain Neuropathic pain is pain caused by damage to the nerves that are responsible for certain sensations in your body (sensory nerves). The pain can be caused by damage to:  The sensory nerves that send signals to your spinal cord and brain (peripheral nervous system).  The sensory nerves in your brain or spinal cord (central nervous system).  Neuropathic pain can make you more sensitive to pain. What would be a minor sensation for most people may feel very painful if you have neuropathic pain. This is usually a long-term condition that can be difficult to treat. The type of pain can differ from person to person. It may start suddenly (acute), or it may develop slowly and last for a long time (chronic). Neuropathic pain may come and go as damaged nerves heal or may stay at the same level for years. It often causes emotional distress, loss of sleep, and a lower quality of life. What are the causes? The most common cause of damage to a sensory nerve is diabetes. Many other diseases and conditions can also cause neuropathic pain. Causes of neuropathic pain can be classified as:  Toxic. Many drugs and chemicals can cause toxic damage. The most common cause of toxic neuropathic pain is damage from drug treatment for cancer (chemotherapy).  Metabolic. This type of pain can happen when a disease causes imbalances that damage nerves. Diabetes is the most common of these diseases. Vitamin B deficiency caused by long-term alcohol abuse is another common cause.  Traumatic. Any injury that cuts, crushes, or stretches a nerve can cause damage and pain. A common example is feeling pain after losing an arm or leg (phantom limb pain).  Compression-related. If a sensory nerve gets trapped or compressed for a long period of time, the blood supply to the nerve can be cut off.  Vascular. Many blood vessel diseases can cause neuropathic pain by decreasing blood supply and oxygen to nerves.  Autoimmune.  This type of pain results from diseases in which the body's defense system mistakenly attacks sensory nerves. Examples of autoimmune diseases that can cause neuropathic pain include lupus and multiple sclerosis.  Infectious. Many types of viral infections can damage sensory nerves and cause pain. Shingles infection is a common cause of this type of pain.  Inherited. Neuropathic pain can be a symptom of many diseases that are passed down through families (genetic).  What are the signs or symptoms? The main symptom is pain. Neuropathic pain is often described as:  Burning.  Shock-like.  Stinging.  Hot or cold.  Itching.  How is this diagnosed? No single test can diagnose neuropathic pain. Your health care provider will do a physical exam and ask you about your pain. You may use a pain scale to describe how bad your pain is. You may also have tests to see if you have a high sensitivity to pain and to help find the cause and location of any sensory nerve damage. These tests may include:  Imaging studies, such as: ? X-rays. ? CT scan. ? MRI.  Nerve conduction studies to test how well nerve signals travel through your sensory nerves (electrodiagnostic testing).  Stimulating your sensory nerves through electrodes on your skin and measuring the response in your spinal cord and brain (somatosensory evoked potentials).  How is this treated? Treatment for neuropathic pain may change over time. You may need to try different treatment options or a combination of treatments. Some options include:  Over-the-counter pain relievers.  Prescription medicines. Some medicines   used to treat other conditions may also help neuropathic pain. These include medicines to: ? Control seizures (anticonvulsants). ? Relieve depression (antidepressants).  Prescription-strength pain relievers (narcotics). These are usually used when other pain relievers do not help.  Transcutaneous nerve stimulation (TENS).  This uses electrical currents to block painful nerve signals. The treatment is painless.  Topical and local anesthetics. These are medicines that numb the nerves. They can be injected as a nerve block or applied to the skin.  Alternative treatments, such as: ? Acupuncture. ? Meditation. ? Massage. ? Physical therapy. ? Pain management programs. ? Counseling.  Follow these instructions at home:  Learn as much as you can about your condition.  Take medicines only as directed by your health care provider.  Work closely with all your health care providers to find what works best for you.  Have a good support system at home.  Consider joining a chronic pain support group. Contact a health care provider if:  Your pain treatments are not helping.  You are having side effects from your medicines.  You are struggling with fatigue, mood changes, depression, or anxiety. This information is not intended to replace advice given to you by your health care provider. Make sure you discuss any questions you have with your health care provider. Document Released: 01/17/2004 Document Revised: 11/09/2015 Document Reviewed: 09/29/2013 Elsevier Interactive Patient Education  2018 Elsevier Inc.  

## 2017-06-09 ENCOUNTER — Encounter: Payer: Self-pay | Admitting: Physician Assistant

## 2017-06-11 ENCOUNTER — Encounter: Payer: Self-pay | Admitting: Physician Assistant

## 2017-06-11 DIAGNOSIS — I340 Nonrheumatic mitral (valve) insufficiency: Secondary | ICD-10-CM

## 2017-06-11 HISTORY — DX: Nonrheumatic mitral (valve) insufficiency: I34.0

## 2017-07-02 ENCOUNTER — Telehealth: Payer: Self-pay

## 2017-07-02 ENCOUNTER — Other Ambulatory Visit: Payer: Self-pay | Admitting: Physician Assistant

## 2017-07-02 DIAGNOSIS — M4800 Spinal stenosis, site unspecified: Secondary | ICD-10-CM

## 2017-07-02 DIAGNOSIS — M545 Low back pain: Secondary | ICD-10-CM

## 2017-07-02 DIAGNOSIS — G8929 Other chronic pain: Secondary | ICD-10-CM

## 2017-07-02 NOTE — Telephone Encounter (Signed)
Called patient - LMOVM in detail advising that per provider after this medication refill of Oxycodone-Acetaminophen 7.5-325 mg, no future refills will be approve. Patient will have to receive care from a Pain Management office for his chronic pain. Patient was advised if there are any questions to contact our office.

## 2017-07-03 ENCOUNTER — Encounter: Payer: Medicare HMO | Admitting: Sports Medicine

## 2017-07-07 ENCOUNTER — Ambulatory Visit (INDEPENDENT_AMBULATORY_CARE_PROVIDER_SITE_OTHER): Payer: Medicare HMO | Admitting: Sports Medicine

## 2017-07-07 ENCOUNTER — Encounter: Payer: Self-pay | Admitting: Sports Medicine

## 2017-07-07 DIAGNOSIS — M4800 Spinal stenosis, site unspecified: Secondary | ICD-10-CM

## 2017-07-07 DIAGNOSIS — M48062 Spinal stenosis, lumbar region with neurogenic claudication: Secondary | ICD-10-CM

## 2017-07-07 DIAGNOSIS — G8929 Other chronic pain: Secondary | ICD-10-CM | POA: Diagnosis not present

## 2017-07-07 DIAGNOSIS — M545 Low back pain: Secondary | ICD-10-CM

## 2017-07-07 MED ORDER — ALPHA-LIPOIC ACID 600 MG PO CAPS: 1.0000 | ORAL_CAPSULE | Freq: Every day | ORAL | 11 refills | Status: DC

## 2017-07-07 MED ORDER — DULOXETINE HCL 60 MG PO CPEP: 60.0000 mg | ORAL_CAPSULE | Freq: Every day | ORAL | 3 refills | Status: DC

## 2017-07-07 NOTE — Progress Notes (Signed)
Subjective:    I'm seeing this patient as a consultation for: Benjamin Fray, PA-C  CC: Back pain, neuropathy  HPI: Ather is a pleasant 83 year old male, he has a long history of back pain, bilateral radicular pain, neurogenic claudication.  He has already seen a pain management provider, he is had multiple spinal injections with moderate relief, he has been on multiple medications, multiple neuropathic agents.  More recently she started Cymbalta which was very effective, we also recommended doing alpha lipoic acid, he is not yet started this.  On further questioning he is also worked with a [place] in the distant past, Dr. Marlis Edelson.  Had a fantastic response here as well.  Principal symptoms are numbness and tingling going down into both legs, cramping in the calves when walking, feels significantly better with flexion of the spine consistent with pain from central canal stenosis.  No new bowel or bladder dysfunction, saddle numbness, constitutional symptoms, no trauma.  I reviewed the past medical history, family history, social history, surgical history, and allergies today and no changes were needed.  Please see the problem list section below in epic for further details.  Past Medical History: Past Medical History:  Diagnosis Date  . Allergy   . Atherosclerosis of aorta (HCC)   . CAD (coronary artery disease)   . Cardiomegaly   . Chronic back pain   . Depression   . Diabetes mellitus without complication (HCC)   . Erectile dysfunction 10/06/2011  . Fatty liver   . GERD (gastroesophageal reflux disease)   . Hiatal hernia 10/06/2011  . History of myocardial infarction   . Hypertension   . Mild mitral regurgitation 06/11/2017  . Osteoarthritis 10/06/2011  . Spinal stenosis   . Umbilical hernia without obstruction or gangrene    small, mesenteric   Past Surgical History: Past Surgical History:  Procedure Laterality Date  . CHOLECYSTECTOMY    . COLONOSCOPY WITH  ESOPHAGOGASTRODUODENOSCOPY (EGD)    . ERCP    . KNEE CARTILAGE SURGERY Bilateral    Social History: Social History   Socioeconomic History  . Marital status: Widowed    Spouse name: None  . Number of children: None  . Years of education: None  . Highest education level: None  Social Needs  . Financial resource strain: None  . Food insecurity - worry: None  . Food insecurity - inability: None  . Transportation needs - medical: None  . Transportation needs - non-medical: None  Occupational History  . None  Tobacco Use  . Smoking status: Current Some Day Smoker    Types: Cigarettes  . Smokeless tobacco: Never Used  . Tobacco comment: 7 cigarettes per week  Substance and Sexual Activity  . Alcohol use: No    Frequency: Never  . Drug use: No  . Sexual activity: Not Currently  Other Topics Concern  . None  Social History Narrative  . None   Family History: No family history on file. Allergies: Allergies  Allergen Reactions  . Cephalexin   . Clarithromycin     Nausea and vomiting  . Lyrica [Pregabalin] Other (See Comments)    "made me a zombie"  . Novocain [Procaine] Itching   Medications: See med rec.  Review of Systems: No headache, visual changes, nausea, vomiting, diarrhea, constipation, dizziness, abdominal pain, skin rash, fevers, chills, night sweats, weight loss, swollen lymph nodes, body aches, joint swelling, muscle aches, chest pain, shortness of breath, mood changes, visual or auditory hallucinations.   Objective:  General: Well Developed, well nourished, and in no acute distress.  Neuro:  Extra-ocular muscles intact, able to move all 4 extremities, sensation grossly intact.  Deep tendon reflexes tested were normal. Psych: Alert and oriented, mood congruent with affect. ENT:  Ears and nose appear unremarkable.  Hearing grossly normal. Neck: Unremarkable overall appearance, trachea midline.  No visible thyroid enlargement. Eyes: Conjunctivae and lids  appear unremarkable.  Pupils equal and round. Skin: Warm and dry, no rashes noted.  Cardiovascular: Pulses palpable, no extremity edema. Back Exam:  Inspection: Somewhat kyphotic Motion: Flexion 45 deg, Extension 45 deg, Side Bending to 45 deg bilaterally,  Rotation to 45 deg bilaterally  SLR laying: Negative  XSLR laying: Negative  Palpable tenderness: None. FABER: negative. Sensory change: Gross sensation intact to all lumbar and sacral dermatomes.  Reflexes: 2+ at both patellar tendons, 2+ at achilles tendons, Babinski's downgoing.  Strength at foot  Plantar-flexion: 5/5 Dorsi-flexion: 5/5 Eversion: 5/5 Inversion: 5/5  Leg strength  Quad: 5/5 Hamstring: 5/5 Hip flexor: 5/5 Hip abductors: 5/5  Slow somewhat shuffling gait.  I did review his abdominal and pelvic CT from many years ago, he has severe multilevel central canal stenosis, degenerative scoliosis, as well as multilevel severe degenerative facet arthritis.  Impression and Recommendations:   This case required medical decision making of moderate complexity.  Neurogenic claudication due to lumbar spinal stenosis [place] has multilevel degenerative changes, severe multilevel facet arthritis and severe multilevel central canal stenosis with bilateral neurogenic claudication. Principal symptoms are numbness and tingling in his lower extremities as well as some cramping in the calves. He had a good response to the addition of Cymbalta, increasing to 60 mg daily, he never really picked up the alpha lipoic acid, I am going to send this and again. We are going to start with this before proceeding with any form of intervention such as facet joint injections or epidurals, I really do not think he is going to be a surgical candidate. He did mention seeing Dr. Marlis Edelson in [place], had a fantastic experience, so I would like him to also look into seeing Dr. Antony Blackbird also at [facility]. I hope to decrease his usage of  his narcotic.  ___________________________________________ Ihor Austin. Benjamin Stain, M.D., ABFM., CAQSM. Primary Care and Sports Medicine [facility]  Adjunct Instructor of Family Medicine  [employer]

## 2017-07-07 NOTE — Assessment & Plan Note (Signed)
Benjamin Ramirez has multilevel degenerative changes, severe multilevel facet arthritis and severe multilevel central canal stenosis with bilateral neurogenic claudication. Principal symptoms are numbness and tingling in his lower extremities as well as some cramping in the calves. He had a good response to the addition of Cymbalta, increasing to 60 mg daily, he never really picked up the alpha lipoic acid, I am going to send this and again. We are going to start with this before proceeding with any form of intervention such as facet joint injections or epidurals, I really do not think he is going to be a surgical candidate. He did mention seeing Dr. Marlis Edelson in [place], had a fantastic experience, so I would like him to also look into seeing Dr. Antony Blackbird also at [facility]. I hope to decrease his usage of his narcotic.

## 2017-08-03 ENCOUNTER — Telehealth: Payer: Self-pay | Admitting: Physician Assistant

## 2017-08-03 ENCOUNTER — Ambulatory Visit: Payer: Medicare HMO | Admitting: Physician Assistant

## 2017-08-03 NOTE — Telephone Encounter (Signed)
Pt called and stated he is out of his OxyCodone and would [facility]. Thanks

## 2017-08-04 ENCOUNTER — Ambulatory Visit: Payer: Medicare HMO | Admitting: Sports Medicine

## 2017-08-04 ENCOUNTER — Other Ambulatory Visit: Payer: Self-pay | Admitting: Physician Assistant

## 2017-08-04 DIAGNOSIS — G8929 Other chronic pain: Secondary | ICD-10-CM

## 2017-08-04 DIAGNOSIS — M4800 Spinal stenosis, site unspecified: Secondary | ICD-10-CM

## 2017-08-04 DIAGNOSIS — M545 Low back pain: Secondary | ICD-10-CM

## 2017-08-04 MED ORDER — OXYCODONE-ACETAMINOPHEN 7.5-325 MG PO TABS: 1.0000 | ORAL_TABLET | Freq: Four times a day (QID) | ORAL | 0 refills | Status: DC | PRN

## 2017-08-04 NOTE — Telephone Encounter (Signed)
Advised patient

## 2017-08-04 NOTE — Telephone Encounter (Signed)
Refill sent I will need to see him in the office in May for 46-month follow-up

## 2017-08-05 ENCOUNTER — Ambulatory Visit: Payer: Medicare HMO | Admitting: Physician Assistant

## 2017-09-01 ENCOUNTER — Other Ambulatory Visit: Payer: Self-pay | Admitting: Physician Assistant

## 2017-09-01 DIAGNOSIS — M545 Low back pain, unspecified: Secondary | ICD-10-CM

## 2017-09-01 DIAGNOSIS — M4800 Spinal stenosis, site unspecified: Secondary | ICD-10-CM

## 2017-09-01 DIAGNOSIS — G8929 Other chronic pain: Secondary | ICD-10-CM

## 2017-09-11 ENCOUNTER — Other Ambulatory Visit: Payer: Self-pay | Admitting: Physician Assistant

## 2017-09-11 DIAGNOSIS — G8929 Other chronic pain: Secondary | ICD-10-CM

## 2017-09-11 DIAGNOSIS — M4800 Spinal stenosis, site unspecified: Secondary | ICD-10-CM

## 2017-09-11 DIAGNOSIS — M545 Low back pain: Secondary | ICD-10-CM

## 2017-09-11 MED ORDER — OXYCODONE-ACETAMINOPHEN 7.5-325 MG PO TABS: 1.0000 | ORAL_TABLET | Freq: Four times a day (QID) | ORAL | 0 refills | Status: DC | PRN

## 2017-10-22 ENCOUNTER — Other Ambulatory Visit: Payer: Self-pay | Admitting: Sports Medicine

## 2017-10-22 DIAGNOSIS — E1159 Type 2 diabetes mellitus with other circulatory complications: Secondary | ICD-10-CM

## 2017-10-22 DIAGNOSIS — I10 Essential (primary) hypertension: Principal | ICD-10-CM

## 2017-10-23 ENCOUNTER — Other Ambulatory Visit: Payer: Self-pay

## 2017-10-23 DIAGNOSIS — M545 Low back pain: Secondary | ICD-10-CM

## 2017-10-23 DIAGNOSIS — G8929 Other chronic pain: Secondary | ICD-10-CM

## 2017-10-23 DIAGNOSIS — M4800 Spinal stenosis, site unspecified: Secondary | ICD-10-CM

## 2017-10-23 MED ORDER — OXYCODONE-ACETAMINOPHEN 7.5-325 MG PO TABS
1.0000 | ORAL_TABLET | Freq: Four times a day (QID) | ORAL | 0 refills | Status: DC | PRN
Start: 2017-10-23 — End: 2018-02-16

## 2017-10-23 NOTE — Telephone Encounter (Signed)
[name], pt's care taker, called requesting refill on pt's Percocet.  Last refilled 09-11-17 for #[id number]   RX pended. Please send if appropriate and route back so I can let pt know.  Thanks!

## 2018-01-21 ENCOUNTER — Other Ambulatory Visit: Payer: Self-pay | Admitting: *Deleted

## 2018-01-21 DIAGNOSIS — G8929 Other chronic pain: Secondary | ICD-10-CM

## 2018-01-21 DIAGNOSIS — M4800 Spinal stenosis, site unspecified: Secondary | ICD-10-CM

## 2018-01-21 DIAGNOSIS — E1159 Type 2 diabetes mellitus with other circulatory complications: Secondary | ICD-10-CM

## 2018-01-21 DIAGNOSIS — M48062 Spinal stenosis, lumbar region with neurogenic claudication: Secondary | ICD-10-CM

## 2018-01-21 DIAGNOSIS — I152 Hypertension secondary to endocrine disorders: Secondary | ICD-10-CM

## 2018-01-21 DIAGNOSIS — I10 Essential (primary) hypertension: Secondary | ICD-10-CM

## 2018-01-21 DIAGNOSIS — M545 Low back pain: Secondary | ICD-10-CM

## 2018-01-21 MED ORDER — LISINOPRIL 10 MG PO TABS
10.0000 mg | ORAL_TABLET | Freq: Every day | ORAL | 1 refills | Status: DC
Start: 2018-01-21 — End: 2018-01-22

## 2018-01-21 MED ORDER — DULOXETINE HCL 60 MG PO CPEP: 60.0000 mg | ORAL_CAPSULE | Freq: Every day | ORAL | 1 refills | Status: DC

## 2018-01-22 ENCOUNTER — Other Ambulatory Visit: Payer: Self-pay

## 2018-01-22 DIAGNOSIS — I152 Hypertension secondary to endocrine disorders: Secondary | ICD-10-CM

## 2018-01-22 DIAGNOSIS — E1159 Type 2 diabetes mellitus with other circulatory complications: Secondary | ICD-10-CM

## 2018-01-22 DIAGNOSIS — M4800 Spinal stenosis, site unspecified: Secondary | ICD-10-CM

## 2018-01-22 DIAGNOSIS — M48062 Spinal stenosis, lumbar region with neurogenic claudication: Secondary | ICD-10-CM

## 2018-01-22 DIAGNOSIS — I10 Essential (primary) hypertension: Principal | ICD-10-CM

## 2018-01-22 DIAGNOSIS — G8929 Other chronic pain: Secondary | ICD-10-CM

## 2018-01-22 DIAGNOSIS — M545 Low back pain, unspecified: Secondary | ICD-10-CM

## 2018-01-22 MED ORDER — DULOXETINE HCL 60 MG PO CPEP: 60.0000 mg | ORAL_CAPSULE | Freq: Every day | ORAL | 0 refills | Status: DC

## 2018-01-22 MED ORDER — LISINOPRIL 10 MG PO TABS: 10.0000 mg | ORAL_TABLET | Freq: Every day | ORAL | 0 refills | Status: DC

## 2018-02-16 ENCOUNTER — Telehealth: Payer: Self-pay

## 2018-02-16 ENCOUNTER — Ambulatory Visit (INDEPENDENT_AMBULATORY_CARE_PROVIDER_SITE_OTHER): Payer: Medicare HMO | Admitting: Physician Assistant

## 2018-02-16 ENCOUNTER — Encounter: Payer: Self-pay | Admitting: Physician Assistant

## 2018-02-16 VITALS — BP 171/82 | HR 61 | Temp 98.0°F | Wt 162.0 lb

## 2018-02-16 DIAGNOSIS — I152 Hypertension secondary to endocrine disorders: Secondary | ICD-10-CM

## 2018-02-16 DIAGNOSIS — M539 Dorsopathy, unspecified: Secondary | ICD-10-CM | POA: Diagnosis not present

## 2018-02-16 DIAGNOSIS — N3001 Acute cystitis with hematuria: Secondary | ICD-10-CM

## 2018-02-16 DIAGNOSIS — E119 Type 2 diabetes mellitus without complications: Secondary | ICD-10-CM

## 2018-02-16 DIAGNOSIS — M48062 Spinal stenosis, lumbar region with neurogenic claudication: Secondary | ICD-10-CM

## 2018-02-16 DIAGNOSIS — I10 Essential (primary) hypertension: Secondary | ICD-10-CM

## 2018-02-16 DIAGNOSIS — R7989 Other specified abnormal findings of blood chemistry: Secondary | ICD-10-CM

## 2018-02-16 DIAGNOSIS — E1159 Type 2 diabetes mellitus with other circulatory complications: Secondary | ICD-10-CM

## 2018-02-16 DIAGNOSIS — G8929 Other chronic pain: Secondary | ICD-10-CM

## 2018-02-16 DIAGNOSIS — I700 Atherosclerosis of aorta: Secondary | ICD-10-CM

## 2018-02-16 LAB — POCT URINALYSIS DIPSTICK
BILIRUBIN UA: NEGATIVE
GLUCOSE UA: NEGATIVE
Ketones, UA: NEGATIVE
Nitrite, UA: POSITIVE
Protein, UA: NEGATIVE
Spec Grav, UA: 1.025 (ref 1.010–1.025)
Urobilinogen, UA: 0.2 E.U./dL
pH, UA: 5.5 (ref 5.0–8.0)

## 2018-02-16 MED ORDER — BLOOD PRESSURE CUFF MISC: 0 refills | Status: DC

## 2018-02-16 MED ORDER — DULOXETINE HCL 60 MG PO CPEP: 60.0000 mg | ORAL_CAPSULE | Freq: Every day | ORAL | 0 refills | Status: DC

## 2018-02-16 MED ORDER — OXYCODONE-ACETAMINOPHEN 7.5-325 MG PO TABS
1.0000 | ORAL_TABLET | Freq: Four times a day (QID) | ORAL | 0 refills | Status: DC | PRN
Start: 2018-02-16 — End: 2018-02-17

## 2018-02-16 MED ORDER — CIPROFLOXACIN HCL 500 MG PO TABS
500.0000 mg | ORAL_TABLET | Freq: Two times a day (BID) | ORAL | 0 refills | Status: AC
Start: 2018-02-16 — End: 2018-03-02

## 2018-02-16 MED ORDER — AMBULATORY NON FORMULARY MEDICATION: 0 refills | Status: DC

## 2018-02-16 MED ORDER — LISINOPRIL 10 MG PO TABS: 10.0000 mg | ORAL_TABLET | Freq: Every day | ORAL | 0 refills | Status: DC

## 2018-02-16 NOTE — Progress Notes (Signed)
HPI:                                                                Benjamin Ramirez is a 83 y.o. male who presents to [facility] today for abdominal pain  He is having bilateral lower abdominal pain for the last 5 days, it is gradually improving. He was having urinary hesitancy, which is unchanged. He was also having worsening bilateral back pain. Denies dysuria or hematuria. Denies urinary frequency or urgency. He is voiding normally and denies urinary retention. Denies fever, chills, change in bowel habits. He states he took OTC Azo because he has a history of kidney stones.  Reports history of having a procedure to his prostate, but this was several years ago at an [place] doctor and he does not have the records.  Spinal Stenosis/Multilevel DDD: states he has been unable to afford his Percocet. He has not filled a prescription in 4 months and has not been seen in our office for the last 7 months. Sports Medicine referred him to Dr. Earl Ramirez, but he states he did not follow up due to cost. He states his prior dose of Percocet only works sometimes and he would like to increase to 10 mg. He also states that [facility] only has 5 and 10 mg tablets; not the 7.5 mg he is prescribed. He has a history of violating his pain contract (taking caregiver/partner's prescription for Hydrocodone).  DMII: controlled with diet.  Denies polydipsia, polyuria, polyphagia. Denies blurred vision or vision change.  Denies ulcers/wounds on feet. Hx of DKA/HHS: no Diabetes associated symptoms: Glucometer: lost it months ago   HTN: taking Lisinopril daily. Compliant with medications. Does not  checks BP's at home. Denies vision change, headache, chest pain with exertion, orthopnea, lightheadedness, syncope and edema. Risk factors include: Type 2 DM, male sex, age>55   Past Medical History:  Diagnosis Date  . Allergy   . Atherosclerosis of aorta ([place])   . CAD  (coronary artery disease)   . Cardiomegaly   . Chronic back pain   . Depression   . Diabetes mellitus without complication ([place])   . Erectile dysfunction 10/06/2011  . Fatty liver   . GERD (gastroesophageal reflux disease)   . Hiatal hernia 10/06/2011  . History of myocardial infarction   . Hypertension   . Mild mitral regurgitation 06/11/2017  . Osteoarthritis 10/06/2011  . Spinal stenosis   . Umbilical hernia without obstruction or gangrene    small, mesenteric   Past Surgical History:  Procedure Laterality Date  . CHOLECYSTECTOMY    . COLONOSCOPY WITH ESOPHAGOGASTRODUODENOSCOPY (EGD)    . ERCP    . KNEE CARTILAGE SURGERY Bilateral    Social History   Tobacco Use  . Smoking status: Current Some Day Smoker    Types: Cigarettes  . Smokeless tobacco: Never Used  . Tobacco comment: 7 cigarettes per week  Substance Use Topics  . Alcohol use: No    Frequency: Never   family history is not on file.    ROS: negative except as noted in the HPI  Medications: Current Outpatient Medications  Medication Sig Dispense Refill  . Alpha-Lipoic Acid 600 MG CAPS Take 1 capsule (600 mg total) by mouth daily. [place]  capsule 11  . aspirin EC 81 MG tablet Take by mouth.    Marland Kitchen atorvastatin (LIPITOR) 10 MG tablet Take 0.5 tablets (5 mg total) by mouth daily. 45 tablet 3  . Blood Glucose Monitoring Suppl (PRODIGY AUTOCODE BLOOD GLUCOSE) w/Device KIT Use to check glucose three times daily if needed for hyperglycemic symptoms and/or hypoglycemic signs.    . DULoxetine (CYMBALTA) 60 MG capsule Take 1 capsule (60 mg total) by mouth daily. 90 capsule 0  . lisinopril (PRINIVIL,ZESTRIL) 10 MG tablet Take 1 tablet (10 mg total) by mouth daily. 90 tablet 0  . oxyCODONE-acetaminophen (PERCOCET) 7.5-325 MG tablet Take 1 tablet by mouth every 6 (six) hours as needed for severe pain. 120 tablet 0  . PRODIGY LANCETS 28G MISC Use to check glucose once to twice daily PRN for hyperglycemia    . senna-docusate  (SENOKOT-S) 8.6-50 MG tablet Take 2 tablets by mouth 2 (two) times daily. Until stooling regularly 60 tablet 0   No current facility-administered medications for this visit.    Allergies  Allergen Reactions  . Cephalexin   . Clarithromycin     Nausea and vomiting  . Lyrica [Pregabalin] Other (See Comments)    "made me a zombie"  . Novocain [Procaine] Itching       Objective:  BP (!) 171/82   Pulse 61   Temp 98 F (36.7 C) (Oral)   Wt 162 lb (73.5 kg)   BMI 26.55 kg/m  Gen:  alert, not ill-appearing, no distress, appropriate for age 48: head normocephalic without obvious abnormality, conjunctiva and cornea clear, trachea midline Pulm: Normal work of breathing, normal phonation, clear to auscultation bilaterally, no wheezes, rales or rhonchi CV: Normal rate, regular rhythm, s1 and s2 distinct, no murmurs, clicks or rubs  GI: abdomen soft, there is suprapubic tenderness, no guarding or rigidity, no CVA tenderness Neuro: alert and oriented x 3, no tremor MSK: extremities atraumatic, normal gait and station Skin: intact, no rashes on exposed skin, no jaundice, no cyanosis Psych: appearance casual, cooperative, good eye contact, euthymic mood, affect mood-congruent, speech is articulate, and thought processes clear and goal-directed  Lab Results  Component Value Date   CREATININE 1.13 (H) 05/29/2017   BUN 17 05/29/2017   NA 143 05/29/2017   K 4.9 05/29/2017   CL 108 05/29/2017   CO2 30 05/29/2017     No results found for this or any previous visit (from the past 72 hour(s)). No results found.    Assessment and Plan: 83 y.o. male with   .Benjamin Ramirez was seen today for flank pain.  Diagnoses and all orders for this visit:  Neurogenic claudication due to lumbar spinal stenosis -     DULoxetine (CYMBALTA) 60 MG capsule; Take 1 capsule (60 mg total) by mouth daily. -     oxyCODONE-acetaminophen (PERCOCET) 7.5-325 MG tablet; Take 1 tablet by mouth every 6 (six) hours as  needed for severe pain.  Encounter for chronic pain management -     DULoxetine (CYMBALTA) 60 MG capsule; Take 1 capsule (60 mg total) by mouth daily. -     oxyCODONE-acetaminophen (PERCOCET) 7.5-325 MG tablet; Take 1 tablet by mouth every 6 (six) hours as needed for severe pain.  Multilevel degenerative disc disease -     DULoxetine (CYMBALTA) 60 MG capsule; Take 1 capsule (60 mg total) by mouth daily. -     oxyCODONE-acetaminophen (PERCOCET) 7.5-325 MG tablet; Take 1 tablet by mouth every 6 (six) hours as needed for severe pain.  Chronic bilateral low back pain without sciatica -     DULoxetine (CYMBALTA) 60 MG capsule; Take 1 capsule (60 mg total) by mouth daily.  Hypertension associated with diabetes ([place]) -     lisinopril (PRINIVIL,ZESTRIL) 10 MG tablet; Take 1 tablet (10 mg total) by mouth daily. -     Renal Function Panel -     Blood Pressure Monitoring (BLOOD PRESSURE CUFF) MISC; Monitor BP twice a day  Elevated serum creatinine  Acute cystitis with hematuria -     ciprofloxacin (CIPRO) 500 MG tablet; Take 1 tablet (500 mg total) by mouth 2 (two) times daily for 14 days. -     Renal Function Panel -     CBC with Differential/Platelet -     POCT Urinalysis Dipstick -     Urine Culture  Diet-controlled diabetes mellitus (HCC) -     Hemoglobin A1c -     AMBULATORY NON FORMULARY MEDICATION; Single glucometer with lancets, test strips. Check morning fasting glucose daily and up to three times daily prn   UA positive for moderate blood, nitrates and large leuks. Suprapubic tenderness on exam. No CVA tenderness. Treating for complicated cystitis with Cipro 500 mg bid x 14 days. Last GFR 61, 8 months ago. Recheck renal function today. Close follow-up in 2 weeks.  BP out of range in office today. Patient reports he did not take his Lisinopril.  Indication for chronic opioid: neurogenic claudication due to lumbar spinal stenosis, multilevel DDD Medication and dose: Percocet 7.5-325  mg # pills per month: 120 Last UDS date: 05/29/17 Opioid Treatment Agreement signed (Y/N): 05/29/17 Opioid Treatment Agreement last reviewed with patient:  today, pharmacy changed to [facility] reviewed this encounter (include red flags):  last fill date 10/23/17 I have concerns about diversion His caregiver/partner was dismissed from this practice/provider for drug-seeking behavior. There is a history of pain contract violation in the past involving diversion. Following the appt, his caregiver/partner returned to our office and requested the pharmacy to be changed and for our office to communicate with her directly. He is supposed to follow-up with me Q29month for refills/pain management and has been lost to follow-up He requested an increased dose today even though he has not taken his opioid medication in 4 months He made many contradictory excuses today including issues with his insurance company changing his pharmacy, stating the pharmacy doesn't carry his dose, stating the cost is too high, claiming that our office cancelled his appts and failed to reschedule him (there is documentation that he cancelled his appts on 08/05/17, 08/04/17, 08/03/17, 07/03/17), and finally transportation issues He understands that he will need to follow-up every 3 months or he will be in violation of his pain contract and will no longer receive opioids from this provider. [facility] encouraged to let the office know if he is having transportation difficulty.  After explaining these terms with patient, he was excessively complimentary toward me regarding the care I am providing    Patient education and anticipatory guidance given Patient agrees with treatment plan Follow-up in 2 weeks for cystitis, Q325monthfor medication mgmt or sooner as needed if symptoms worsen or fail to improve  ChDarlyne RussianA-C

## 2018-02-16 NOTE — Telephone Encounter (Signed)
Pt and [name] came back into office after appointment and requested that all medications that were sent to [facility] be cancelled and sent to mail order.   I advised pt that he should go ahead and pick up his antibiotic from pharmacy and I would notify provider and assistant about transferring other medications. I advised pt there was a change the Percocet would not be sent to mail order since mail order requires 90 day and he states he wants it to go to [facility] and they assure him it would be covered for 30 day to be sent to mail order.   I advised pt I would let provider know . Pt asked we call [name] with update on medications

## 2018-02-16 NOTE — Patient Instructions (Signed)
Urinary Tract Infection, Adult  A urinary tract infection (UTI) is an infection of any part of the urinary tract, which includes the kidneys, ureters, bladder, and urethra. These organs make, store, and get rid of urine in the body. UTI can be a bladder infection (cystitis) or kidney infection (pyelonephritis).  What are the causes?  This infection may be caused by fungi, viruses, or bacteria. Bacteria are the most common cause of UTIs. This condition can also be caused by repeated incomplete emptying of the bladder during urination.  What increases the risk?  This condition is more likely to develop if:   You ignore your need to urinate or hold urine for long periods of time.   You do not empty your bladder completely during urination.   You wipe back to front after urinating or having a bowel movement, if you are male.   You are uncircumcised, if you are male.   You are constipated.   You have a urinary catheter that stays in place (indwelling).   You have a weak defense (immune) system.   You have a medical condition that affects your bowels, kidneys, or bladder.   You have diabetes.   You take antibiotic medicines frequently or for long periods of time, and the antibiotics no longer work well against certain types of infections (antibiotic resistance).   You take medicines that irritate your urinary tract.   You are exposed to chemicals that irritate your urinary tract.   You are male.    What are the signs or symptoms?  Symptoms of this condition include:   Fever.   Frequent urination or passing small amounts of urine frequently.   Needing to urinate urgently.   Pain or burning with urination.   Urine that smells bad or unusual.   Cloudy urine.   Pain in the lower abdomen or back.   Trouble urinating.   Blood in the urine.   Vomiting or being less hungry than normal.   Diarrhea or abdominal pain.   Vaginal discharge, if you are male.    How is this diagnosed?  This condition is  diagnosed with a medical history and physical exam. You will also need to provide a urine sample to test your urine. Other tests may be done, including:   Blood tests.   Sexually transmitted disease (STD) testing.    If you have had more than one UTI, a cystoscopy or imaging studies may be done to determine the cause of the infections.  How is this treated?  Treatment for this condition often includes a combination of two or more of the following:   Antibiotic medicine.   Other medicines to treat less common causes of UTI.   Over-the-counter medicines to treat pain.   Drinking enough water to stay hydrated.    Follow these instructions at home:   Take over-the-counter and prescription medicines only as told by your health care provider.   If you were prescribed an antibiotic, take it as told by your health care provider. Do not stop taking the antibiotic even if you start to feel better.   Avoid alcohol, caffeine, tea, and carbonated beverages. They can irritate your bladder.   Drink enough fluid to keep your urine clear or pale yellow.   Keep all follow-up visits as told by your health care provider. This is important.   Make sure to:  ? Empty your bladder often and completely. Do not hold urine for long periods of time.  ?   Empty your bladder before and after sex.  ? Wipe from front to back after a bowel movement if you are male. Use each tissue one time when you wipe.  Contact a health care provider if:   You have back pain.   You have a fever.   You feel nauseous or vomit.   Your symptoms do not get better after 3 days.   Your symptoms go away and then return.  Get help right away if:   You have severe back pain or lower abdominal pain.   You are vomiting and cannot keep down any medicines or water.  This information is not intended to replace advice given to you by your health care provider. Make sure you discuss any questions you have with your health care provider.  Document Released:  01/29/2005 Document Revised: 10/03/2015 Document Reviewed: 03/12/2015  Elsevier Interactive Patient Education  2018 [employer]

## 2018-02-17 LAB — CBC WITH DIFFERENTIAL/PLATELET
Basophils Absolute: 50 cells/uL (ref 0–200)
Basophils Relative: 0.9 %
EOS ABS: 342 {cells}/uL (ref 15–500)
Eosinophils Relative: 6.1 %
HCT: 43.6 % (ref 38.5–50.0)
HEMOGLOBIN: 15 g/dL (ref 13.2–17.1)
Lymphs Abs: 1585 cells/uL (ref 850–3900)
MCH: 31.3 pg (ref 27.0–33.0)
MCHC: 34.4 g/dL (ref 32.0–36.0)
MCV: 91 fL (ref 80.0–100.0)
MONOS PCT: 7.1 %
MPV: 9.3 fL (ref 7.5–12.5)
NEUTROS PCT: 57.6 %
Neutro Abs: 3226 cells/uL (ref 1500–7800)
Platelets: 164 10*3/uL (ref 140–400)
RBC: 4.79 10*6/uL (ref 4.20–5.80)
RDW: 13.3 % (ref 11.0–15.0)
TOTAL LYMPHOCYTE: 28.3 %
WBC mixed population: 398 cells/uL (ref 200–950)
WBC: 5.6 10*3/uL (ref 3.8–10.8)

## 2018-02-17 LAB — RENAL FUNCTION PANEL
Albumin: 4.2 g/dL (ref 3.6–5.1)
BUN / CREAT RATIO: 18 (calc) (ref 6–22)
BUN: 21 mg/dL (ref 7–25)
CO2: 27 mmol/L (ref 20–32)
Calcium: 9.4 mg/dL (ref 8.6–10.3)
Chloride: 106 mmol/L (ref 98–110)
Creat: 1.18 mg/dL — ABNORMAL HIGH (ref 0.70–1.11)
Glucose, Bld: 87 mg/dL (ref 65–99)
Phosphorus: 3.6 mg/dL (ref 2.1–4.3)
Potassium: 4.5 mmol/L (ref 3.5–5.3)
SODIUM: 143 mmol/L (ref 135–146)

## 2018-02-17 LAB — HEMOGLOBIN A1C
HEMOGLOBIN A1C: 5.9 %{Hb} — AB (ref <5.7)
Mean Plasma Glucose: 123 (calc)
eAG (mmol/L): 6.8 (calc)

## 2018-02-17 MED ORDER — LISINOPRIL 10 MG PO TABS: 10.0000 mg | ORAL_TABLET | Freq: Every day | ORAL | 1 refills | Status: DC

## 2018-02-17 MED ORDER — ATORVASTATIN CALCIUM 10 MG PO TABS: 5.0000 mg | ORAL_TABLET | Freq: Every day | ORAL | 1 refills | Status: DC

## 2018-02-17 MED ORDER — OXYCODONE-ACETAMINOPHEN 7.5-325 MG PO TABS: 1.0000 | ORAL_TABLET | Freq: Four times a day (QID) | ORAL | 0 refills | Status: DC | PRN

## 2018-02-17 NOTE — Telephone Encounter (Signed)
Called [facility], Pt did not pick up any Rx's yesterday. They did questions quantity on Cipro, verified with PCP and updated quantity to 28 tabs. They will get ready for Pt to pick up.

## 2018-02-17 NOTE — Telephone Encounter (Signed)
Called [facility], they sent out a 90 day supply of Cymbalta yesterday to the Patient, delivery confirmed. They last sent lisinopril to Pt on 01/26/18, no refills on file. Will send a new one so they can send out when needed.   [facility] will fill oxycodone, but they require a mail printed Rx. Will not accept faxed or electronically sent versions.   Called Pt on cell [phone]). He was with [name]. He put me on speaker phone and stated "talk to my caregiver [name], she knows what to do about my mediations." [name] confirmed they received Cymbalta Rx yesterday from [facility]. Inquired about Oxycodone Rx. Advised we would have to mail the Rx to [facility], and that would take about 10 days. Then another 2 days for shipping. She replied to still send Rx to mail order. Neill agreed to still send to mail order. Reiterated the timeline, and advised once we make this Rx change, it is to stay with the same pharmacy - both verbalized understanding. [name] reports "I'll just call you when he has 10 days left so you can mail the next Rx." I advised that was fine, but we would be mailing dated Rx's to keep him on scheduled - both verbalized understanding.  [name] and Eissa state they will go to pharmacy today to get antibiotic Rx, states they didn't have any money to get it yesterday.   Called [facility], cancelled Rx's for lisinopril, Cymbalta, and oxycodone.   Refills that need to be addressed are pended for PCP approval.

## 2018-02-17 NOTE — Telephone Encounter (Signed)
1. He will need to make a final decision on his pharmacy for his pain medication and his pain contract needs to be updated accordingly. He will need to use this pharmacy for the next year and we will not be able to alternate between the [facility] and mail order. He will need to follow-up with me in 1 month for refills.  2. He is going to have withdrawal symptoms from Duloxetine if he waits for mail order. Highly recommend he fill a 30 day supply at his local pharmacy. The rx was sent yesterday.  3. Fleet Contras, we have sent 90 days to both mail order and [facility] over the last month. Can you contact [facility] and find out what medications they need?  4. We will not be communicating with [name] regarding his medications. I communicate directly with all of my patients unless they have dementia or a cognitive deficit that prevents them from understanding. This prevents miscommunication.

## 2018-02-18 LAB — URINE CULTURE
MICRO NUMBER: 91238848
SPECIMEN QUALITY: ADEQUATE

## 2018-02-18 NOTE — Telephone Encounter (Signed)
Printed Oxycodone Rx placed in mail to [facility].

## 2018-03-02 ENCOUNTER — Ambulatory Visit: Payer: Medicare HMO | Admitting: Physician Assistant

## 2018-04-08 ENCOUNTER — Other Ambulatory Visit: Payer: Self-pay | Admitting: Sports Medicine

## 2018-04-08 DIAGNOSIS — M539 Dorsopathy, unspecified: Secondary | ICD-10-CM

## 2018-04-08 DIAGNOSIS — G8929 Other chronic pain: Secondary | ICD-10-CM

## 2018-04-08 DIAGNOSIS — M48062 Spinal stenosis, lumbar region with neurogenic claudication: Secondary | ICD-10-CM

## 2018-08-09 ENCOUNTER — Other Ambulatory Visit: Payer: Self-pay | Admitting: Physician Assistant

## 2018-08-09 DIAGNOSIS — M48062 Spinal stenosis, lumbar region with neurogenic claudication: Secondary | ICD-10-CM

## 2018-08-09 DIAGNOSIS — M539 Dorsopathy, unspecified: Secondary | ICD-10-CM

## 2018-08-09 DIAGNOSIS — G8929 Other chronic pain: Secondary | ICD-10-CM

## 2018-09-22 ENCOUNTER — Other Ambulatory Visit: Payer: Self-pay | Admitting: Physician Assistant

## 2018-09-22 DIAGNOSIS — M539 Dorsopathy, unspecified: Secondary | ICD-10-CM

## 2018-09-22 DIAGNOSIS — M48062 Spinal stenosis, lumbar region with neurogenic claudication: Secondary | ICD-10-CM

## 2018-09-22 DIAGNOSIS — G8929 Other chronic pain: Secondary | ICD-10-CM

## 2018-11-08 ENCOUNTER — Other Ambulatory Visit: Payer: Self-pay | Admitting: Physician Assistant

## 2018-11-08 ENCOUNTER — Other Ambulatory Visit: Payer: Self-pay | Admitting: Sports Medicine

## 2018-11-08 DIAGNOSIS — I152 Hypertension secondary to endocrine disorders: Secondary | ICD-10-CM

## 2018-11-08 DIAGNOSIS — G8929 Other chronic pain: Secondary | ICD-10-CM

## 2018-11-08 DIAGNOSIS — E1159 Type 2 diabetes mellitus with other circulatory complications: Secondary | ICD-10-CM

## 2018-11-08 DIAGNOSIS — M539 Dorsopathy, unspecified: Secondary | ICD-10-CM

## 2018-11-08 DIAGNOSIS — M48062 Spinal stenosis, lumbar region with neurogenic claudication: Secondary | ICD-10-CM

## 2018-11-08 NOTE — Telephone Encounter (Signed)
To PCP

## 2018-11-08 NOTE — Telephone Encounter (Signed)
Tried calling pt both numbers on file gives a busy tone -EH/RMA

## 2018-11-11 ENCOUNTER — Encounter: Payer: Self-pay | Admitting: Physician Assistant

## 2018-11-11 ENCOUNTER — Other Ambulatory Visit: Payer: Self-pay | Admitting: Physician Assistant

## 2018-11-11 ENCOUNTER — Other Ambulatory Visit: Payer: Self-pay

## 2018-11-11 ENCOUNTER — Ambulatory Visit (INDEPENDENT_AMBULATORY_CARE_PROVIDER_SITE_OTHER): Payer: Medicare PPO | Admitting: Physician Assistant

## 2018-11-11 VITALS — BP 123/83 | HR 101 | Temp 99.0°F | Wt 169.0 lb

## 2018-11-11 DIAGNOSIS — M539 Dorsopathy, unspecified: Secondary | ICD-10-CM

## 2018-11-11 DIAGNOSIS — I10 Essential (primary) hypertension: Secondary | ICD-10-CM

## 2018-11-11 DIAGNOSIS — F4321 Adjustment disorder with depressed mood: Secondary | ICD-10-CM

## 2018-11-11 DIAGNOSIS — I700 Atherosclerosis of aorta: Secondary | ICD-10-CM

## 2018-11-11 DIAGNOSIS — M48062 Spinal stenosis, lumbar region with neurogenic claudication: Secondary | ICD-10-CM | POA: Diagnosis not present

## 2018-11-11 DIAGNOSIS — K760 Fatty (change of) liver, not elsewhere classified: Secondary | ICD-10-CM

## 2018-11-11 DIAGNOSIS — E1159 Type 2 diabetes mellitus with other circulatory complications: Secondary | ICD-10-CM | POA: Diagnosis not present

## 2018-11-11 DIAGNOSIS — M4696 Unspecified inflammatory spondylopathy, lumbar region: Secondary | ICD-10-CM

## 2018-11-11 DIAGNOSIS — E118 Type 2 diabetes mellitus with unspecified complications: Secondary | ICD-10-CM

## 2018-11-11 DIAGNOSIS — K4090 Unilateral inguinal hernia, without obstruction or gangrene, not specified as recurrent: Secondary | ICD-10-CM

## 2018-11-11 DIAGNOSIS — F419 Anxiety disorder, unspecified: Secondary | ICD-10-CM

## 2018-11-11 DIAGNOSIS — Z79899 Other long term (current) drug therapy: Secondary | ICD-10-CM

## 2018-11-11 LAB — POCT GLYCOSYLATED HEMOGLOBIN (HGB A1C): HbA1c, POC (prediabetic range): 6.4 % (ref 5.7–6.4)

## 2018-11-11 MED ORDER — PRODIGY NO CODING BLOOD GLUC VI STRP: ORAL_STRIP | 0 refills | Status: DC

## 2018-11-11 MED ORDER — ATORVASTATIN CALCIUM 10 MG PO TABS: 5.0000 mg | ORAL_TABLET | Freq: Every day | ORAL | 0 refills | Status: DC

## 2018-11-11 MED ORDER — METFORMIN HCL 500 MG PO TABS: 500.0000 mg | ORAL_TABLET | Freq: Every day | ORAL | 0 refills | Status: DC

## 2018-11-11 MED ORDER — BUSPIRONE HCL 5 MG PO TABS: 5.0000 mg | ORAL_TABLET | Freq: Three times a day (TID) | ORAL | 0 refills | Status: DC | PRN

## 2018-11-11 MED ORDER — DULOXETINE HCL 60 MG PO CPEP: 60.0000 mg | ORAL_CAPSULE | Freq: Every day | ORAL | 0 refills | Status: DC

## 2018-11-11 MED ORDER — LISINOPRIL 10 MG PO TABS: 10.0000 mg | ORAL_TABLET | Freq: Every day | ORAL | 0 refills | Status: DC

## 2018-11-11 NOTE — Patient Instructions (Addendum)
Diabetes Preventive Care: - annual foot exam  - annual dilated eye exam with an eye doctor - self foot exams at least weekly - pneumonia vaccine once (booster in 5 years and at age 84) - annual influenza vaccine - twice yearly dental cleanings and yearly exam - goal blood pressure <140/90, ideally <130/80 - LDL cholesterol <70 - A1C <7.0 - body mass index (BMI) <25.0 - follow-up every 3 months if your A1C is not at goal - follow-up every 6 months if diabetes is well controlled     Living With Anxiety  After being diagnosed with an anxiety disorder, you may be relieved to know why you have felt or behaved a certain way. It is natural to also feel overwhelmed about the treatment ahead and what it will mean for your life. With care and support, you can manage this condition and recover from it. How to cope with anxiety Dealing with stress Stress is your body's reaction to life changes and events, both good and bad. Stress can last just a few hours or it can be ongoing. Stress can play a major role in anxiety, so it is important to learn both how to cope with stress and how to think about it differently. Talk with your health care provider or a counselor to learn more about stress reduction. He or she may suggest some stress reduction techniques, such as:  Music therapy. This can include creating or listening to music that you enjoy and that inspires you.  Mindfulness-based meditation. This involves being aware of your normal breaths, rather than trying to control your breathing. It can be done while sitting or walking.  Centering prayer. This is a kind of meditation that involves focusing on a word, phrase, or sacred image that is meaningful to you and that brings you peace.  Deep breathing. To do this, expand your stomach and inhale slowly through your nose. Hold your breath for 3-5 seconds. Then exhale slowly, allowing your stomach muscles to relax.  Self-talk. This is a skill where  you identify thought patterns that lead to anxiety reactions and correct those thoughts.  Muscle relaxation. This involves tensing muscles then relaxing them. Choose a stress reduction technique that fits your lifestyle and personality. Stress reduction techniques take time and practice. Set aside 5-15 minutes a day to do them. Therapists can offer training in these techniques. The training may be covered by some insurance plans. Other things you can do to manage stress include:  Keeping a stress diary. This can help you learn what triggers your stress and ways to control your response.  Thinking about how you respond to certain situations. You may not be able to control everything, but you can control your reaction.  Making time for activities that help you relax, and not feeling guilty about spending your time in this way. Therapy combined with coping and stress-reduction skills provides the best chance for successful treatment. Medicines Medicines can help ease symptoms. Medicines for anxiety include:  Anti-anxiety drugs.  Antidepressants.  Beta-blockers. Medicines may be used as the main treatment for anxiety disorder, along with therapy, or if other treatments are not working. Medicines should be prescribed by a health care provider. Relationships Relationships can play a big part in helping you recover. Try to spend more time connecting with trusted friends and family members. Consider going to couples counseling, taking family education classes, or going to family therapy. Therapy can help you and others better understand the condition. How to recognize changes  in your condition Everyone has a different response to treatment for anxiety. Recovery from anxiety happens when symptoms decrease and stop interfering with your daily activities at home or work. This may mean that you will start to:  Have better concentration and focus.  Sleep better.  Be less irritable.  Have more  energy.  Have improved memory. It is important to recognize when your condition is getting worse. Contact your health care provider if your symptoms interfere with home or work and you do not feel like your condition is improving. Where to find help and support: You can get help and support from these sources:  Self-help groups.  Online and [employer].  A trusted spiritual leader.  Couples counseling.  Family education classes.  Family therapy. Follow these instructions at home:  Eat a healthy diet that includes plenty of vegetables, fruits, whole grains, low-fat dairy products, and lean protein. Do not eat a lot of foods that are high in solid fats, added sugars, or salt.  Exercise. Most adults should do the following: ? Exercise for at least 150 minutes each week. The exercise should increase your heart rate and make you sweat (moderate-intensity exercise). ? Strengthening exercises at least twice a week.  Cut down on caffeine, tobacco, alcohol, and other potentially harmful substances.  Get the right amount and quality of sleep. Most adults need 7-9 hours of sleep each night.  Make choices that simplify your life.  Take over-the-counter and prescription medicines only as told by your health care provider.  Avoid caffeine, alcohol, and certain over-the-counter cold medicines. These may make you feel worse. Ask your pharmacist which medicines to avoid.  Keep all follow-up visits as told by your health care provider. This is important. Questions to ask your health care provider  Would I benefit from therapy?  How often should I follow up with a health care provider?  How long do I need to take medicine?  Are there any long-term side effects of my medicine?  Are there any alternatives to taking medicine? Contact a health care provider if:  You have a hard time staying focused or finishing daily tasks.  You spend many hours a day feeling worried about  everyday life.  You become exhausted by worry.  You start to have headaches, feel tense, or have nausea.  You urinate more than normal.  You have diarrhea. Get help right away if:  You have a racing heart and shortness of breath.  You have thoughts of hurting yourself or others. If you ever feel like you may hurt yourself or others, or have thoughts about taking your own life, get help right away. You can go to your nearest emergency department or call:  Your local emergency services (911 in the [place]  A suicide crisis helpline, such as the National Suicide Prevention Lifeline at 765 315 7908. This is open 24-hours a day. Summary  Taking steps to deal with stress can help calm you.  Medicines cannot cure anxiety disorders, but they can help ease symptoms.  Family, friends, and partners can play a big part in helping you recover from an anxiety disorder. This information is not intended to replace advice given to you by your health care provider. Make sure you discuss any questions you have with your health care provider. Document Released: 04/15/2016 Document Revised: 04/03/2017 Document Reviewed: 04/15/2016 Elsevier Patient Education  2020 [employer].

## 2018-11-11 NOTE — Progress Notes (Signed)
HPI:                                                                Benjamin Ramirez is a 84 y.o. male who presents to [facility] [place] today for medication management  Patient has been lost to follow-up since October 2019. Pain contact was violated.  Did not return for urine drug screen.  Patient is here with caregiver today, [name], who provides most of the history.  Caregiver states that they have been out of test strips for several months, so she occasionally will give him 1 of her metformins.  She states prior to running out of test strips his morning fasting sugar was 90-120.  Caregiver states that he also occasionally shakes and has heart palpitations so she will give him half of 1 of her metoprolol.  Patient reports since 2013 he has had a right-sided hernia that occasionally becomes firm and tender.  Caregiver states that she can feel it bulging out and because she has experience with reducing hernias she is able to reduce it herself.  Patient denies any severe pain or change in bowel habits.  Patient reports that he recently lost a close friend and has been grief stricken.  Caregiver states that he is frequently anxious and needs something for anxiety. He currently takes Cymbalta 60 mg for lumbar spinal stenosis.    Past Medical History:  Diagnosis Date  . Allergy   . Atherosclerosis of aorta ([place])   . CAD (coronary artery disease)   . Cardiomegaly   . Chronic back pain   . Depression   . Diabetes mellitus without complication ([place])   . Erectile dysfunction 10/06/2011  . Fatty liver   . GERD (gastroesophageal reflux disease)   . Hiatal hernia 10/06/2011  . History of myocardial infarction   . Hypertension   . Mild mitral regurgitation 06/11/2017  . Osteoarthritis 10/06/2011  . Spinal stenosis   . Umbilical hernia without obstruction or gangrene    small, mesenteric   Past Surgical History:  Procedure Laterality Date  .  CHOLECYSTECTOMY    . COLONOSCOPY WITH ESOPHAGOGASTRODUODENOSCOPY (EGD)    . ERCP    . KNEE CARTILAGE SURGERY Bilateral    Social History   Tobacco Use  . Smoking status: Current Some Day Smoker    Types: Cigarettes  . Smokeless tobacco: Never Used  . Tobacco comment: 7 cigarettes per week  Substance Use Topics  . Alcohol use: No    Frequency: Never   family history is not on file.    ROS: negative except as noted in the HPI  Medications: Current Outpatient Medications  Medication Sig Dispense Refill  . Alpha-Lipoic Acid 600 MG CAPS Take 1 capsule (600 mg total) by mouth daily. 30 capsule 11  . AMBULATORY NON FORMULARY MEDICATION Single glucometer with lancets, test strips. Check morning fasting glucose daily and up to three times daily prn 1 each 0  . aspirin EC 81 MG tablet Take by mouth.    Marland Kitchen atorvastatin (LIPITOR) 10 MG tablet Take 0.5 tablets (5 mg total) by mouth daily. 30 tablet 0  . Blood Glucose Monitoring Suppl (PRODIGY AUTOCODE BLOOD GLUCOSE) w/Device KIT Use to check glucose three times daily if needed for hyperglycemic symptoms  and/or hypoglycemic signs.    . Blood Pressure Monitoring (BLOOD PRESSURE CUFF) MISC Monitor BP twice a day 1 each 0  . DULoxetine (CYMBALTA) 60 MG capsule Take 1 capsule (60 mg total) by mouth daily. 30 capsule 0  . lisinopril (ZESTRIL) 10 MG tablet Take 1 tablet (10 mg total) by mouth daily. 30 tablet 0  . PRODIGY LANCETS 28G MISC Use to check glucose once to twice daily PRN for hyperglycemia    . busPIRone (BUSPAR) 5 MG tablet Take 1 tablet (5 mg total) by mouth 3 (three) times daily as needed. 90 tablet 0  . glucose blood (PRODIGY NO CODING BLOOD GLUC) test strip Use as instructed 100 each 0  . metFORMIN (GLUCOPHAGE) 500 MG tablet Take 1 tablet (500 mg total) by mouth daily with supper. 30 tablet 0   No current facility-administered medications for this visit.    Allergies  Allergen Reactions  . Lyrica [Pregabalin] Other (See Comments)     "made me a zombie"  . Cephalexin Nausea And Vomiting  . Clarithromycin Nausea And Vomiting  . Novocain [Procaine] Itching       Objective:  BP 123/83   Pulse (!) 101   Temp 99 F (37.2 C) (Oral)   Wt 169 lb (76.7 kg)   SpO2 96%   BMI 27.70 kg/m  Gen:  alert, not ill-appearing, no distress, appropriate for age, overweight male HEENT: head normocephalic without obvious abnormality, conjunctiva and cornea clear, trachea midline Pulm: Normal work of breathing, normal phonation, clear to auscultation bilaterally, no wheezes, rales or rhonchi CV: Normal rate at 88 bpm on recheck, regular rhythm, s1 and s2 distinct, no murmurs, clicks or rubs GI: Abdomen soft, there is right lower quadrant tenderness without guarding or rigidity, nontender umbilical hernia present GU: Patient had pain with palpation of the inguinal ring, there is prominent fullness of the right mons pubis and a palpable inguinal hernia on the right side Neuro: alert and oriented x 3, no tremor MSK: extremities atraumatic, normal gait and station, no peripheral edema Skin: intact, no rashes on exposed skin, no jaundice, no cyanosis Psych: well-groomed, cooperative, good eye contact, depressed mood, affect mood-congruent, speech is articulate, and thought processes clear and goal-directed, no SI  A chaperone was used for the GU exam, Dr. Lynne Leader  Results for orders placed or performed in visit on 11/11/18 (from the past 72 hour(s))  POCT HgB A1C     Status: None   Collection Time: 11/11/18  5:06 PM  Result Value Ref Range   Hemoglobin A1C     HbA1c POC (<> result, manual entry)     HbA1c, POC (prediabetic range) 6.4 5.7 - 6.4 %   HbA1c, POC (controlled diabetic range)     No results found.    Assessment and Plan: 84 y.o. male with   .[name] was seen today for medication management.  Diagnoses and all orders for this visit:  Inflammatory spondylopathy of lumbar region [facility]) -     Ambulatory referral to  Pain Clinic -     DULoxetine (CYMBALTA) 60 MG capsule; Take 1 capsule (60 mg total) by mouth daily.  Neurogenic claudication due to lumbar spinal stenosis -     Ambulatory referral to Pain Clinic -     DULoxetine (CYMBALTA) 60 MG capsule; Take 1 capsule (60 mg total) by mouth daily.  Aortic atherosclerosis (H[facility]) -     Lipid Panel w/reflex Direct LDL -     atorvastatin (LIPITOR) 10 MG tablet;  Take 0.5 tablets (5 mg total) by mouth daily.  Hypertension associated with diabetes ([place]) -     COMPLETE METABOLIC PANEL WITH GFR -     TSH + free T4 -     lisinopril (ZESTRIL) 10 MG tablet; Take 1 tablet (10 mg total) by mouth daily.  Fatty liver -     COMPLETE METABOLIC PANEL WITH GFR -     CBC  Encounter for long-term (current) use of medications -     COMPLETE METABOLIC PANEL WITH GFR -     CBC -     TSH + free T4 -     Lipid Panel w/reflex Direct LDL  Grief reaction -     busPIRone (BUSPAR) 5 MG tablet; Take 1 tablet (5 mg total) by mouth 3 (three) times daily as needed.  Anxiety -     busPIRone (BUSPAR) 5 MG tablet; Take 1 tablet (5 mg total) by mouth 3 (three) times daily as needed.  Multilevel degenerative disc disease -     DULoxetine (CYMBALTA) 60 MG capsule; Take 1 capsule (60 mg total) by mouth daily.  Controlled type 2 diabetes mellitus with complication, without long-term current use of insulin (HCC) -     glucose blood (PRODIGY NO CODING BLOOD GLUC) test strip; Use as instructed -     metFORMIN (GLUCOPHAGE) 500 MG tablet; Take 1 tablet (500 mg total) by mouth daily with supper. -     POCT HgB A1C  Inguinal hernia of right side without obstruction or gangrene -     Ambulatory referral to General Surgery   I advised patient's caregiver not to give him any medications that are not prescribed to him.  I advised her on the dangers of administering a beta-blocker to him in the setting of unknown heart rhythm or heart rate.  Encouraged patient and caregiver to schedule him for  an office visit when he is experiencing palpitations/heart racing  Discussed with patient and caregiver that pain contract was violated by being lost to follow-up and not returning for urine drug screen.  Will not be prescribing any controlled substances here at this practice.  Patient was referred to pain management today  Type 2 diabetes-A1c has increased slightly to 6.4, still well controlled.  Since he has been taking low-dose metformin occasionally I will start him on 500 mg daily with largest meal LDL goal less than 70, continue atorvastatin Counseled on diabetes preventive care Foot exam deferred today due to time constraints  Hypertension BP goal less than 140/90, continue lisinopril Continue baby aspirin and statin for primary prevention  Grief reaction Continue Cymbalta 60 mg Adding BuSpar 5 mg 3 times daily as needed We will avoid benzodiazepines since patient plans to resume therapeutic opioids with pain management for his severe lumbar spinal stenosis  Right inguinal hernia Patient examined by myself and sports medicine, Dr. Lynne Leader Referral to general surgery for further eval  Patient education and anticipatory guidance given Patient agrees with treatment plan Follow-up in 1 month for anxiety/diabetic foot exam or sooner as needed if symptoms worsen or fail to improve  Darlyne Russian PA-C

## 2018-11-12 ENCOUNTER — Other Ambulatory Visit: Payer: Self-pay | Admitting: Physician Assistant

## 2018-11-12 ENCOUNTER — Encounter: Payer: Self-pay | Admitting: Physician Assistant

## 2018-11-12 DIAGNOSIS — E118 Type 2 diabetes mellitus with unspecified complications: Secondary | ICD-10-CM

## 2018-11-12 MED ORDER — PRODIGY NO CODING BLOOD GLUC VI STRP: ORAL_STRIP | 0 refills | Status: DC

## 2018-11-27 LAB — COMPLETE METABOLIC PANEL WITH GFR
AG Ratio: 1.5 (calc) (ref 1.0–2.5)
ALT: 17 U/L (ref 9–46)
AST: 24 U/L (ref 10–35)
Albumin: 4.6 g/dL (ref 3.6–5.1)
Alkaline phosphatase (APISO): 67 U/L (ref 35–144)
BUN/Creatinine Ratio: 21 (calc) (ref 6–22)
BUN: 32 mg/dL — ABNORMAL HIGH (ref 7–25)
CO2: 26 mmol/L (ref 20–32)
Calcium: 10.2 mg/dL (ref 8.6–10.3)
Chloride: 99 mmol/L (ref 98–110)
Creat: 1.55 mg/dL — ABNORMAL HIGH (ref 0.70–1.11)
GFR, Est African American: 48 mL/min/{1.73_m2} — ABNORMAL LOW (ref >=60)
GFR, Est Non African American: 41 mL/min/{1.73_m2} — ABNORMAL LOW (ref >=60)
Globulin: 3 g/dL (calc) (ref 1.9–3.7)
Glucose, Bld: 174 mg/dL — ABNORMAL HIGH (ref 65–99)
Potassium: 4.4 mmol/L (ref 3.5–5.3)
Sodium: 138 mmol/L (ref 135–146)
Total Bilirubin: 1.1 mg/dL (ref 0.2–1.2)
Total Protein: 7.6 g/dL (ref 6.1–8.1)

## 2018-11-27 LAB — LIPID PANEL W/REFLEX DIRECT LDL
Cholesterol: 180 mg/dL (ref <200)
HDL: 39 mg/dL — ABNORMAL LOW (ref >=40)
LDL Cholesterol (Calc): 111 mg/dL (calc) — ABNORMAL HIGH
Non-HDL Cholesterol (Calc): 141 mg/dL (calc) — ABNORMAL HIGH (ref <130)
Total CHOL/HDL Ratio: 4.6 (calc) (ref <5.0)
Triglycerides: 178 mg/dL — ABNORMAL HIGH (ref <150)

## 2018-11-27 LAB — CBC
HCT: 50.8 % — ABNORMAL HIGH (ref 38.5–50.0)
Hemoglobin: 17.5 g/dL — ABNORMAL HIGH (ref 13.2–17.1)
MCH: 32.1 pg (ref 27.0–33.0)
MCHC: 34.4 g/dL (ref 32.0–36.0)
MCV: 93.2 fL (ref 80.0–100.0)
MPV: 9.6 fL (ref 7.5–12.5)
Platelets: 226 10*3/uL (ref 140–400)
RBC: 5.45 10*6/uL (ref 4.20–5.80)
RDW: 13 % (ref 11.0–15.0)
WBC: 7.8 10*3/uL (ref 3.8–10.8)

## 2018-11-27 LAB — TSH+FREE T4: TSH W/REFLEX TO FT4: 2.47 mIU/L (ref 0.40–4.50)

## 2018-11-30 ENCOUNTER — Other Ambulatory Visit: Payer: Self-pay | Admitting: Physician Assistant

## 2018-11-30 DIAGNOSIS — N183 Chronic kidney disease, stage 3 unspecified: Secondary | ICD-10-CM

## 2018-11-30 DIAGNOSIS — E1159 Type 2 diabetes mellitus with other circulatory complications: Secondary | ICD-10-CM

## 2018-11-30 DIAGNOSIS — M4696 Unspecified inflammatory spondylopathy, lumbar region: Secondary | ICD-10-CM

## 2018-11-30 DIAGNOSIS — E118 Type 2 diabetes mellitus with unspecified complications: Secondary | ICD-10-CM

## 2018-11-30 DIAGNOSIS — R7989 Other specified abnormal findings of blood chemistry: Secondary | ICD-10-CM

## 2018-11-30 DIAGNOSIS — I700 Atherosclerosis of aorta: Secondary | ICD-10-CM

## 2018-11-30 DIAGNOSIS — M48062 Spinal stenosis, lumbar region with neurogenic claudication: Secondary | ICD-10-CM

## 2018-11-30 DIAGNOSIS — I152 Hypertension secondary to endocrine disorders: Secondary | ICD-10-CM

## 2018-11-30 DIAGNOSIS — M539 Dorsopathy, unspecified: Secondary | ICD-10-CM

## 2018-11-30 MED ORDER — DULOXETINE HCL 60 MG PO CPEP: 60.0000 mg | ORAL_CAPSULE | Freq: Every day | ORAL | 0 refills | Status: DC

## 2018-11-30 MED ORDER — ATORVASTATIN CALCIUM 10 MG PO TABS: 5.0000 mg | ORAL_TABLET | Freq: Every day | ORAL | 1 refills | Status: DC

## 2018-11-30 MED ORDER — METFORMIN HCL 500 MG PO TABS: 500.0000 mg | ORAL_TABLET | Freq: Every day | ORAL | 0 refills | Status: DC

## 2018-11-30 MED ORDER — LISINOPRIL 10 MG PO TABS: 10.0000 mg | ORAL_TABLET | Freq: Every day | ORAL | 0 refills | Status: DC

## 2018-11-30 NOTE — Progress Notes (Signed)
Kidney function is decreased compared to last year Do not take more than 1 x 500 mg dose of Metformin Avoid OTC anti-inflammatories like aspirin, aleve, advil, etc. (Tylenol okay) Important to keep blood pressure under control, goal <140/90 Repeat kidney function and urine in 10 days, then in 3 months Refills sent to [facility]

## 2018-12-13 ENCOUNTER — Ambulatory Visit: Payer: Medicare PPO | Admitting: Physician Assistant

## 2019-03-21 ENCOUNTER — Other Ambulatory Visit: Payer: Self-pay | Admitting: Physician Assistant

## 2019-03-21 DIAGNOSIS — I152 Hypertension secondary to endocrine disorders: Secondary | ICD-10-CM

## 2019-03-21 DIAGNOSIS — E1159 Type 2 diabetes mellitus with other circulatory complications: Secondary | ICD-10-CM

## 2019-03-21 NOTE — Telephone Encounter (Signed)
Patient wanted to make sure a refill was sent to [facility] for his BP medication and it was sent today. No other questions.

## 2019-04-05 ENCOUNTER — Telehealth: Payer: Self-pay | Admitting: Physician Assistant

## 2019-04-05 NOTE — Telephone Encounter (Signed)
[name] would like to know if you would take Benjamin Ramirez as a new patient. She said you guys spoke about it before. He is having high blood pressure issues. Please advise.

## 2019-04-05 NOTE — Telephone Encounter (Signed)
Sure

## 2019-04-05 NOTE — Telephone Encounter (Signed)
Taken care of. Appointment has been made.

## 2019-04-08 ENCOUNTER — Ambulatory Visit: Payer: Medicare PPO | Admitting: Physician Assistant

## 2019-06-21 ENCOUNTER — Ambulatory Visit (INDEPENDENT_AMBULATORY_CARE_PROVIDER_SITE_OTHER): Payer: Medicare HMO | Admitting: Physician Assistant

## 2019-06-21 ENCOUNTER — Other Ambulatory Visit: Payer: Self-pay

## 2019-06-21 ENCOUNTER — Encounter: Payer: Self-pay | Admitting: Physician Assistant

## 2019-06-21 VITALS — BP 140/73 | HR 90 | Ht 64.0 in | Wt 173.0 lb

## 2019-06-21 DIAGNOSIS — G629 Polyneuropathy, unspecified: Secondary | ICD-10-CM | POA: Diagnosis not present

## 2019-06-21 DIAGNOSIS — E1159 Type 2 diabetes mellitus with other circulatory complications: Secondary | ICD-10-CM

## 2019-06-21 DIAGNOSIS — F419 Anxiety disorder, unspecified: Secondary | ICD-10-CM

## 2019-06-21 DIAGNOSIS — E118 Type 2 diabetes mellitus with unspecified complications: Secondary | ICD-10-CM | POA: Diagnosis not present

## 2019-06-21 DIAGNOSIS — M48062 Spinal stenosis, lumbar region with neurogenic claudication: Secondary | ICD-10-CM

## 2019-06-21 DIAGNOSIS — E785 Hyperlipidemia, unspecified: Secondary | ICD-10-CM

## 2019-06-21 DIAGNOSIS — Z23 Encounter for immunization: Secondary | ICD-10-CM

## 2019-06-21 DIAGNOSIS — M25562 Pain in left knee: Secondary | ICD-10-CM

## 2019-06-21 DIAGNOSIS — M539 Dorsopathy, unspecified: Secondary | ICD-10-CM | POA: Diagnosis not present

## 2019-06-21 DIAGNOSIS — I152 Hypertension secondary to endocrine disorders: Secondary | ICD-10-CM

## 2019-06-21 DIAGNOSIS — G8929 Other chronic pain: Secondary | ICD-10-CM

## 2019-06-21 DIAGNOSIS — I10 Essential (primary) hypertension: Secondary | ICD-10-CM

## 2019-06-21 DIAGNOSIS — E782 Mixed hyperlipidemia: Secondary | ICD-10-CM | POA: Diagnosis not present

## 2019-06-21 DIAGNOSIS — M4696 Unspecified inflammatory spondylopathy, lumbar region: Secondary | ICD-10-CM | POA: Diagnosis not present

## 2019-06-21 DIAGNOSIS — M25561 Pain in right knee: Secondary | ICD-10-CM | POA: Diagnosis not present

## 2019-06-21 LAB — POCT GLYCOSYLATED HEMOGLOBIN (HGB A1C): Hemoglobin A1C: 6.8 % — AB (ref 4.0–5.6)

## 2019-06-21 MED ORDER — PRODIGY NO CODING BLOOD GLUC VI STRP: ORAL_STRIP | 0 refills | Status: DC

## 2019-06-21 MED ORDER — LISINOPRIL 10 MG PO TABS: 10.0000 mg | ORAL_TABLET | Freq: Every day | ORAL | 1 refills | Status: DC

## 2019-06-21 MED ORDER — GABAPENTIN 300 MG PO CAPS: ORAL_CAPSULE | ORAL | 1 refills | Status: DC

## 2019-06-21 MED ORDER — AMBULATORY NON FORMULARY MEDICATION: 0 refills | Status: DC

## 2019-06-21 MED ORDER — METFORMIN HCL 500 MG PO TABS: 500.0000 mg | ORAL_TABLET | Freq: Two times a day (BID) | ORAL | 1 refills | Status: DC

## 2019-06-21 MED ORDER — DULOXETINE HCL 60 MG PO CPEP: 60.0000 mg | ORAL_CAPSULE | Freq: Every day | ORAL | 1 refills | Status: DC

## 2019-06-21 MED ORDER — BUSPIRONE HCL 5 MG PO TABS: 5.0000 mg | ORAL_TABLET | Freq: Three times a day (TID) | ORAL | 1 refills | Status: DC | PRN

## 2019-06-21 NOTE — Patient Instructions (Signed)
Restart gabapentin.  Stay on metformin.    Diabetes Mellitus and Nutrition, Adult When you have diabetes (diabetes mellitus), it is very important to have healthy eating habits because your blood sugar (glucose) levels are greatly affected by what you eat and drink. Eating healthy foods in the appropriate amounts, at about the same times every day, can help you:  Control your blood glucose.  Lower your risk of heart disease.  Improve your blood pressure.  Reach or maintain a healthy weight. Every person with diabetes is different, and each person has different needs for a meal plan. Your health care provider may recommend that you work with a diet and nutrition specialist (dietitian) to make a meal plan that is best for you. Your meal plan may vary depending on factors such as:  The calories you need.  The medicines you take.  Your weight.  Your blood glucose, blood pressure, and cholesterol levels.  Your activity level.  Other health conditions you have, such as heart or kidney disease. How do carbohydrates affect me? Carbohydrates, also called carbs, affect your blood glucose level more than any other type of food. Eating carbs naturally raises the amount of glucose in your blood. Carb counting is a method for keeping track of how many carbs you eat. Counting carbs is important to keep your blood glucose at a healthy level, especially if you use insulin or take certain oral diabetes medicines. It is important to know how many carbs you can safely have in each meal. This is different for every person. Your dietitian can help you calculate how many carbs you should have at each meal and for each snack. Foods that contain carbs include:  Bread, cereal, rice, pasta, and crackers.  Potatoes and corn.  Peas, beans, and lentils.  Milk and yogurt.  Fruit and juice.  Desserts, such as cakes, cookies, ice cream, and candy. How does alcohol affect me? Alcohol can cause a sudden  decrease in blood glucose (hypoglycemia), especially if you use insulin or take certain oral diabetes medicines. Hypoglycemia can be a life-threatening condition. Symptoms of hypoglycemia (sleepiness, dizziness, and confusion) are similar to symptoms of having too much alcohol. If your health care provider says that alcohol is safe for you, follow these guidelines:  Limit alcohol intake to no more than 1 drink per day for nonpregnant women and 2 drinks per day for men. One drink equals 12 oz of beer, 5 oz of wine, or 1 oz of hard liquor.  Do not drink on an empty stomach.  Keep yourself hydrated with water, diet soda, or unsweetened iced tea.  Keep in mind that regular soda, juice, and other mixers may contain a lot of sugar and must be counted as carbs. What are tips for following this plan?  Reading food labels  Start by checking the serving size on the "Nutrition Facts" label of packaged foods and drinks. The amount of calories, carbs, fats, and other nutrients listed on the label is based on one serving of the item. Many items contain more than one serving per package.  Check the total grams (g) of carbs in one serving. You can calculate the number of servings of carbs in one serving by dividing the total carbs by 15. For example, if a food has 30 g of total carbs, it would be equal to 2 servings of carbs.  Check the number of grams (g) of saturated and trans fats in one serving. Choose foods that have low or no  amount of these fats.  Check the number of milligrams (mg) of salt (sodium) in one serving. Most people should limit total sodium intake to less than 2,300 mg per day.  Always check the nutrition information of foods labeled as "low-fat" or "nonfat". These foods may be higher in added sugar or refined carbs and should be avoided.  Talk to your dietitian to identify your daily goals for nutrients listed on the label. Shopping  Avoid buying canned, premade, or processed foods.  These foods tend to be high in fat, sodium, and added sugar.  Shop around the outside edge of the grocery store. This includes fresh fruits and vegetables, bulk grains, fresh meats, and fresh dairy. Cooking  Use low-heat cooking methods, such as baking, instead of high-heat cooking methods like deep frying.  Cook using healthy oils, such as olive, canola, or sunflower oil.  Avoid cooking with butter, cream, or high-fat meats. Meal planning  Eat meals and snacks regularly, preferably at the same times every day. Avoid going long periods of time without eating.  Eat foods high in fiber, such as fresh fruits, vegetables, beans, and whole grains. Talk to your dietitian about how many servings of carbs you can eat at each meal.  Eat 4-6 ounces (oz) of lean protein each day, such as lean meat, chicken, fish, eggs, or tofu. One oz of lean protein is equal to: ? 1 oz of meat, chicken, or fish. ? 1 egg. ?  cup of tofu.  Eat some foods each day that contain healthy fats, such as avocado, nuts, seeds, and fish. Lifestyle  Check your blood glucose regularly.  Exercise regularly as told by your health care provider. This may include: ? 150 minutes of moderate-intensity or vigorous-intensity exercise each week. This could be brisk walking, biking, or water aerobics. ? Stretching and doing strength exercises, such as yoga or weightlifting, at least 2 times a week.  Take medicines as told by your health care provider.  Do not use any products that contain nicotine or tobacco, such as cigarettes and e-cigarettes. If you need help quitting, ask your health care provider.  Work with a Social worker or diabetes educator to identify strategies to manage stress and any emotional and social challenges. Questions to ask a health care provider  Do I need to meet with a diabetes educator?  Do I need to meet with a dietitian?  What number can I call if I have questions?  When are the best times to check  my blood glucose? Where to find more information:  American Diabetes Association: diabetes.org  Academy of Nutrition and Dietetics: www.eatright.[employer] of Diabetes and Digestive and Kidney Diseases (NIH): [employer] Summary  A healthy meal plan will help you control your blood glucose and maintain a healthy lifestyle.  Working with a diet and nutrition specialist (dietitian) can help you make a meal plan that is best for you.  Keep in mind that carbohydrates (carbs) and alcohol have immediate effects on your blood glucose levels. It is important to count carbs and to use alcohol carefully. This information is not intended to replace advice given to you by your health care provider. Make sure you discuss any questions you have with your health care provider. Document Revised: 04/03/2017 Document Reviewed: 05/26/2016 Elsevier Patient Education  2020 [employer].

## 2019-06-21 NOTE — Progress Notes (Signed)
Subjective:    Patient ID: Benjamin Ramirez, male    DOB: 1934-12-25, 85 y.o.   MRN: [id number]  HPI  Pt is a 85 yo male with T2DM, HTN, Aortic atherosclerosis, CKD, neuropathy, chronic low back pain due to lumbar spinal stenosis who presents to the clinic to establish care after PCP left clinic.   He admits he has not been compliant with medications. He admits he is not checking his BS because he does not have test strips. He assumed if he was not checking BS then he should not take medication. He is taking his lipitor. He is not watching his diet. He does stay active.   He denies any CP, palpitations, headaches or vision changes. He is not taking his lisinopril.   He does continue to smoke. He has only smoked for the last year. He states " he can quit any time".   He is in a lot of pain and numbness and tingling with low back. He is not taking gabapentin. He states he ran out and didn't know if he should take. He is also having problems with bilateral knees. Hx of viscus injections per patient that helped for a while.   He does feel anxious a lot but not taking any medication for this. Unaware of trying buspar that is on med list.   .. Active Ambulatory Problems    Diagnosis Date Noted  . Chronic sinusitis 10/06/2011  . Erectile dysfunction 10/06/2011  . GERD (gastroesophageal reflux disease) 10/06/2011  . Hiatal hernia 10/06/2011  . S/P ERCP 09/22/2011  . Seasonal allergies 10/06/2011  . Spinal stenosis 05/29/2017  . Spondylitis (HCC) 10/06/2011  . History of myocardial infarction   . Hypertension associated with diabetes (HCC) 05/29/2017  . Insomnia secondary to chronic pain 05/29/2017  . Encounter for chronic pain management 05/29/2017  . Neurogenic claudication due to lumbar spinal stenosis 05/29/2017  . Therapeutic opioid induced constipation 05/29/2017  . Diet-controlled diabetes mellitus (HCC) 06/05/2017  . Umbilical hernia without obstruction or gangrene   . Fatty liver   .  Hearing difficulty of both ears 06/05/2017  . Peripheral polyneuropathy 06/05/2017  . Aortic atherosclerosis (HCC) 06/05/2017  . Venous stasis dermatitis of both lower extremities 06/05/2017  . Muscle atrophy of lower extremity 06/05/2017  . Onychomycosis due to dermatophyte 06/05/2017  . History of echocardiogram 06/05/2017  . Cardiomegaly 06/05/2017  . Pain management contract signed 06/05/2017  . Mild mitral regurgitation 06/11/2017  . Elevated serum creatinine 02/16/2018  . CKD (chronic kidney disease) stage 3, GFR 30-59 ml/min 06/22/2019  . Hyperlipidemia LDL goal <70 06/22/2019  . Chronic pain of both knees 06/22/2019  . Anxiety 06/22/2019  . Multilevel degenerative disc disease 06/22/2019  . Controlled type 2 diabetes mellitus with complication, without long-term current use of insulin (HCC) 06/22/2019   Resolved Ambulatory Problems    Diagnosis Date Noted  . Diabetes mellitus with neurological manifestations, controlled (HCC) 07/26/2014  . Myocardial infarction (HCC) 10/06/2011  . Osteoarthritis 10/06/2011  . Acute cystitis with hematuria 02/16/2018   Past Medical History:  Diagnosis Date  . Allergy   . Atherosclerosis of aorta (HCC)   . CAD (coronary artery disease)   . Chronic back pain   . Depression   . Diabetes mellitus without complication (HCC)   . Hypertension       Review of Systems See HPI>     Objective:   Physical Exam Vitals reviewed.  Constitutional:      Appearance: Normal appearance.  Cardiovascular:  Rate and Rhythm: Normal rate and regular rhythm.  Pulmonary:     Effort: Pulmonary effort is normal.     Breath sounds: Normal breath sounds.  Musculoskeletal:     Right lower leg: No edema.     Left lower leg: No edema.  Neurological:     General: No focal deficit present.     Mental Status: He is alert and oriented to person, place, and time.  Psychiatric:        Mood and Affect: Mood normal.         Assessment & Plan:   Marland KitchenMarland KitchenAndreu was seen today for follow-up.  Diagnoses and all orders for this visit:  Controlled type 2 diabetes mellitus with complication, without long-term current use of insulin (HCC) -     POCT glycosylated hemoglobin (Hb A1C) -     glucose blood (PRODIGY NO CODING BLOOD GLUC) test strip; Check morning fasting glucose daily and up to QID prn -     COMPLETE METABOLIC PANEL WITH GFR -     metFORMIN (GLUCOPHAGE) 500 MG tablet; Take 1 tablet (500 mg total) by mouth 2 (two) times daily with a meal.  Peripheral polyneuropathy -     gabapentin (NEURONTIN) 300 MG capsule; Take one tablet three times a day. -     COMPLETE METABOLIC PANEL WITH GFR  Multilevel degenerative disc disease -     gabapentin (NEURONTIN) 300 MG capsule; Take one tablet three times a day. -     DULoxetine (CYMBALTA) 60 MG capsule; Take 1 capsule (60 mg total) by mouth daily.  Neurogenic claudication due to lumbar spinal stenosis -     gabapentin (NEURONTIN) 300 MG capsule; Take one tablet three times a day. -     DULoxetine (CYMBALTA) 60 MG capsule; Take 1 capsule (60 mg total) by mouth daily.  Inflammatory spondylopathy of lumbar region (HCC) -     gabapentin (NEURONTIN) 300 MG capsule; Take one tablet three times a day. -     DULoxetine (CYMBALTA) 60 MG capsule; Take 1 capsule (60 mg total) by mouth daily.  Hypertension associated with diabetes (HCC) -     lisinopril (ZESTRIL) 10 MG tablet; Take 1 tablet (10 mg total) by mouth daily.  Anxiety -     busPIRone (BUSPAR) 5 MG tablet; Take 1 tablet (5 mg total) by mouth 3 (three) times daily as needed.  Hyperlipidemia LDL goal <70 -     Lipid Panel w/reflex Direct LDL  Chronic pain of both knees  Need for shingles vaccine -     AMBULATORY NON FORMULARY MEDICATION; shingrx 2 doses to prevent shingles.   .. Lab Results  Component Value Date   HGBA1C 6.8 (A) 06/21/2019   A1C to goal under 7 but 1 point increase from last check.  Restart metformin and take  twice a day with meals.  Discussed DM diet.  Continue to exercise and be active.  On ACE. Make sure you are taking this. Goal BP under 130/80.  On statin. Will recheck Lipid.  Needs eye exam.  UTD pneumonia.  Pt declined flu shot.  .. Diabetic Foot Exam - Simple   Simple Foot Form Diabetic Foot exam was performed with the following findings: Yes 06/21/2019  3:39 PM  Visual Inspection No deformities, no ulcerations, no other skin breakdown bilaterally: Yes See comments: Yes Sensation Testing Intact to touch and monofilament testing bilaterally: Yes Pulse Check Posterior Tibialis and Dorsalis pulse intact bilaterally: Yes Comments Toe nails thick and dystrophic  Discussed smoking cessation. Pt agrees to quit. He is smoking 2 cigs a day. He will have a quit date in next 3 months. Denies any help with medication. Pt aware of risk if he continues to smoke.   Discussed shingles vaccine. rx printed to take to pharmacy.   Needs to see Dr. Darene Lamer for pain. Make sure taking cymbalta daily and Gabapentin. Per patient taking 300mg  tablets.he has been out discussed a slow taper up to three times a day. He likely has OA of both knees. No NSAIDs. forllow up with Dr. Darene Lamer for management and planning.   Follow up ASAP with Dr. Darene Lamer.  3 months with me.   Spent 45 minutes with patient care.

## 2019-06-22 ENCOUNTER — Encounter: Payer: Self-pay | Admitting: Physician Assistant

## 2019-06-22 ENCOUNTER — Other Ambulatory Visit: Payer: Self-pay | Admitting: Neurology

## 2019-06-22 DIAGNOSIS — M4696 Unspecified inflammatory spondylopathy, lumbar region: Secondary | ICD-10-CM

## 2019-06-22 DIAGNOSIS — F419 Anxiety disorder, unspecified: Secondary | ICD-10-CM | POA: Insufficient documentation

## 2019-06-22 DIAGNOSIS — G8929 Other chronic pain: Secondary | ICD-10-CM | POA: Insufficient documentation

## 2019-06-22 DIAGNOSIS — M539 Dorsopathy, unspecified: Secondary | ICD-10-CM

## 2019-06-22 DIAGNOSIS — I700 Atherosclerosis of aorta: Secondary | ICD-10-CM

## 2019-06-22 DIAGNOSIS — E118 Type 2 diabetes mellitus with unspecified complications: Secondary | ICD-10-CM | POA: Insufficient documentation

## 2019-06-22 DIAGNOSIS — E785 Hyperlipidemia, unspecified: Secondary | ICD-10-CM | POA: Insufficient documentation

## 2019-06-22 DIAGNOSIS — N183 Chronic kidney disease, stage 3 unspecified: Secondary | ICD-10-CM | POA: Insufficient documentation

## 2019-06-22 DIAGNOSIS — M48062 Spinal stenosis, lumbar region with neurogenic claudication: Secondary | ICD-10-CM

## 2019-06-22 DIAGNOSIS — M25562 Pain in left knee: Secondary | ICD-10-CM | POA: Insufficient documentation

## 2019-06-22 DIAGNOSIS — E1142 Type 2 diabetes mellitus with diabetic polyneuropathy: Secondary | ICD-10-CM | POA: Insufficient documentation

## 2019-06-22 LAB — COMPLETE METABOLIC PANEL WITH GFR
AG Ratio: 1.6 (calc) (ref 1.0–2.5)
ALT: 21 U/L (ref 9–46)
AST: 27 U/L (ref 10–35)
Albumin: 4.5 g/dL (ref 3.6–5.1)
Alkaline phosphatase (APISO): 60 U/L (ref 35–144)
BUN/Creatinine Ratio: 13 (calc) (ref 6–22)
BUN: 17 mg/dL (ref 7–25)
CO2: 29 mmol/L (ref 20–32)
Calcium: 9.7 mg/dL (ref 8.6–10.3)
Chloride: 104 mmol/L (ref 98–110)
Creat: 1.34 mg/dL — ABNORMAL HIGH (ref 0.70–1.11)
GFR, Est African American: 57 mL/min/{1.73_m2} — ABNORMAL LOW (ref >=60)
GFR, Est Non African American: 49 mL/min/{1.73_m2} — ABNORMAL LOW (ref >=60)
Globulin: 2.8 g/dL (calc) (ref 1.9–3.7)
Glucose, Bld: 158 mg/dL — ABNORMAL HIGH (ref 65–99)
Potassium: 5.1 mmol/L (ref 3.5–5.3)
Sodium: 140 mmol/L (ref 135–146)
Total Bilirubin: 1.2 mg/dL (ref 0.2–1.2)
Total Protein: 7.3 g/dL (ref 6.1–8.1)

## 2019-06-22 LAB — LIPID PANEL W/REFLEX DIRECT LDL
Cholesterol: 133 mg/dL (ref <200)
HDL: 38 mg/dL — ABNORMAL LOW (ref >=40)
LDL Cholesterol (Calc): 78 mg/dL (calc)
Non-HDL Cholesterol (Calc): 95 mg/dL (calc) (ref <130)
Total CHOL/HDL Ratio: 3.5 (calc) (ref <5.0)
Triglycerides: 85 mg/dL (ref <150)

## 2019-06-22 MED ORDER — DULOXETINE HCL 60 MG PO CPEP: 60.0000 mg | ORAL_CAPSULE | Freq: Two times a day (BID) | ORAL | 1 refills | Status: DC

## 2019-06-22 MED ORDER — ATORVASTATIN CALCIUM 10 MG PO TABS: 10.0000 mg | ORAL_TABLET | Freq: Every day | ORAL | 3 refills | Status: DC

## 2019-06-22 NOTE — Progress Notes (Signed)
Call pt:  LDL is almost to goal of under 70 at 78 LDL. My suggestion is to increase lipitor to full 10mg  tablet. Can we change rx and directions and sent 90 supply with 3 refills.   Kidney function has declined but better than 6 months ago. You do still have evidence of chronic kidney disease. Stay hydrated, stay away from processed foods, keep diabetes and blood pressure under control, do not take any NSAIDs. We will continue to monitor.   Liver looks great.

## 2019-06-24 ENCOUNTER — Other Ambulatory Visit: Payer: Self-pay | Admitting: *Deleted

## 2019-06-27 ENCOUNTER — Other Ambulatory Visit: Payer: Self-pay

## 2019-06-27 ENCOUNTER — Encounter: Payer: Self-pay | Admitting: Sports Medicine

## 2019-06-27 ENCOUNTER — Ambulatory Visit (INDEPENDENT_AMBULATORY_CARE_PROVIDER_SITE_OTHER): Payer: Medicare HMO

## 2019-06-27 ENCOUNTER — Ambulatory Visit (INDEPENDENT_AMBULATORY_CARE_PROVIDER_SITE_OTHER): Payer: Medicare HMO | Admitting: Sports Medicine

## 2019-06-27 DIAGNOSIS — M5136 Other intervertebral disc degeneration, lumbar region: Secondary | ICD-10-CM | POA: Diagnosis not present

## 2019-06-27 DIAGNOSIS — M170 Bilateral primary osteoarthritis of knee: Secondary | ICD-10-CM

## 2019-06-27 DIAGNOSIS — M48062 Spinal stenosis, lumbar region with neurogenic claudication: Secondary | ICD-10-CM

## 2019-06-27 DIAGNOSIS — M1711 Unilateral primary osteoarthritis, right knee: Secondary | ICD-10-CM | POA: Diagnosis not present

## 2019-06-27 DIAGNOSIS — M1712 Unilateral primary osteoarthritis, left knee: Secondary | ICD-10-CM | POA: Diagnosis not present

## 2019-06-27 DIAGNOSIS — M545 Low back pain: Secondary | ICD-10-CM | POA: Diagnosis not present

## 2019-06-27 MED ORDER — PREDNISONE 50 MG PO TABS: ORAL_TABLET | ORAL | 0 refills | Status: DC

## 2019-06-27 NOTE — Assessment & Plan Note (Signed)
Baylon has had years of knee pain, left worse than right. Today it is worse at the medial joint line of his left knee. This was injected today, adding x-rays, return to see me in 1 month, we will do Orthovisc if no better.

## 2019-06-27 NOTE — Assessment & Plan Note (Signed)
Harpreet continues to have low back pain, with left worse than right neurogenic claudication and radiculitis. Adding a 5-day burst of prednisone, formal physical therapy, x-rays. Return to see me in 4 to 6 weeks, MRI for interventional planning if no better. Continue gabapentin and Cymbalta which have been tremendously effective.

## 2019-06-27 NOTE — Progress Notes (Signed)
    Procedures performed today:    Procedure: Real-time Ultrasound Guided injection of the left knee Device: Samsung HS60  Verbal informed consent obtained.  Time-out conducted.  Noted no overlying erythema, induration, or other signs of local infection.  Skin prepped in a sterile fashion.  Local anesthesia: Topical Ethyl chloride.  With sterile technique and under real time ultrasound guidance: 1 cc Kenalog 40, 2 cc lidocaine, 2 cc bupivacaine injected easily Completed without difficulty  Pain immediately resolved suggesting accurate placement of the medication.  Advised to call if fevers/chills, erythema, induration, drainage, or persistent bleeding.  Images permanently stored and available for review in the ultrasound unit.  Impression: Technically successful ultrasound guided injection.  Independent interpretation of tests performed by another provider:   None.  Impression and Recommendations:    Primary osteoarthritis of both knees Tylek has had years of knee pain, left worse than right. Today it is worse at the medial joint line of his left knee. This was injected today, adding x-rays, return to see me in 1 month, we will do Orthovisc if no better.  Neurogenic claudication due to lumbar spinal stenosis Jasiel continues to have low back pain, with left worse than right neurogenic claudication and radiculitis. Adding a 5-day burst of prednisone, formal physical therapy, x-rays. Return to see me in 4 to 6 weeks, MRI for interventional planning if no better. Continue gabapentin and Cymbalta which have been tremendously effective.    ___________________________________________ Ihor Austin. Benjamin Stain, M.D., ABFM., CAQSM. Primary Care and Sports Medicine [place] [facility]  Adjunct Instructor of Family Medicine  [employer]

## 2019-07-05 ENCOUNTER — Telehealth: Payer: Self-pay | Admitting: Physician Assistant

## 2019-07-05 ENCOUNTER — Telehealth: Payer: Self-pay | Admitting: Neurology

## 2019-07-05 DIAGNOSIS — E118 Type 2 diabetes mellitus with unspecified complications: Secondary | ICD-10-CM

## 2019-07-05 MED ORDER — ACCU-CHEK NANO SMARTVIEW W/DEVICE KIT: 1.0000 | PACK | Freq: Four times a day (QID) | 0 refills | Status: DC

## 2019-07-05 NOTE — Telephone Encounter (Signed)
Prodigy autocode meter kit denied by insurance. Per Luvenia Starch to order Accu-chek. Order entered.

## 2019-07-05 NOTE — Telephone Encounter (Signed)
Received fax from [facility] and they denied coverage on Prodigy Autocode meter kit.  It is not medically necessary and it is a non preferred item. Placing in providers box for review. - CF

## 2019-07-14 ENCOUNTER — Ambulatory Visit: Payer: Medicare HMO | Admitting: Rehabilitative and Restorative Service Providers"

## 2019-08-04 ENCOUNTER — Ambulatory Visit: Payer: Medicare HMO | Admitting: Sports Medicine

## 2019-08-08 ENCOUNTER — Ambulatory Visit (INDEPENDENT_AMBULATORY_CARE_PROVIDER_SITE_OTHER): Payer: Medicare HMO | Admitting: Sports Medicine

## 2019-08-08 ENCOUNTER — Encounter: Payer: Self-pay | Admitting: Sports Medicine

## 2019-08-08 ENCOUNTER — Other Ambulatory Visit: Payer: Self-pay

## 2019-08-08 ENCOUNTER — Telehealth: Payer: Self-pay | Admitting: Sports Medicine

## 2019-08-08 DIAGNOSIS — M48062 Spinal stenosis, lumbar region with neurogenic claudication: Secondary | ICD-10-CM | POA: Diagnosis not present

## 2019-08-08 DIAGNOSIS — M170 Bilateral primary osteoarthritis of knee: Secondary | ICD-10-CM | POA: Diagnosis not present

## 2019-08-08 NOTE — Telephone Encounter (Signed)
Benjamin Ramirez has x-ray confirmed osteoarthritis that has failed oral analgesics, steroid injections, we need Orthovisc approval for both knees though we will probably only use it on the left.  Please call patient to schedule when done.

## 2019-08-08 NOTE — Progress Notes (Signed)
    Procedures performed today:    Procedure: Real-time Ultrasound Guided aspiration of left knee Device: Samsung HS60  Verbal informed consent obtained.  Time-out conducted.  Noted no overlying erythema, induration, or other signs of local infection.  Skin prepped in a sterile fashion.  Local anesthesia: Topical Ethyl chloride.  With sterile technique and under real time ultrasound guidance:  Using 18-gauge needle aspirated 24 cc of mostly clear, straw-colored fluid.  This will be sent off for testing.   Completed without difficulty  Pain immediately resolved suggesting accurate placement of the medication.  Advised to call if fevers/chills, erythema, induration, drainage, or persistent bleeding.  Images permanently stored and available for review in the ultrasound unit.  Impression: Technically successful ultrasound guided injection.  Independent interpretation of notes and tests performed by another provider:   None.  Brief History, Exam, Impression, and Recommendations:    Primary osteoarthritis of both knees Elizabeth returns, is having worsening knee pain, we injected his knee at the last visit, he returns today with worsening of swelling, pain. I performed an arthrocentesis today, we will send this off for crystal analysis, cell counts and cultures. I am also going to get him approved for Orthovisc. I am hesitant to add any NSAIDs or acetaminophen-containing analgesics particularly with his increasing weight, and fluid retention. He agrees to follow-up with his PCP ASAP for this.  Neurogenic claudication due to lumbar spinal stenosis Kieren continues to have low back pain, left worse than right neurogenic claudication and radiculitis. He did not respond much to the prednisone, unfortunately he never did the physical therapy. Switching to home health PT, he is homebound. Continue gabapentin and Cymbalta. Return to see me 4 to 6 weeks after PT, we will likely order an MRI at that  time.    ___________________________________________ Ihor Austin. Benjamin Stain, M.D., ABFM., CAQSM. Primary Care and Sports Medicine [place] [facility]  Adjunct Instructor of Family Medicine  [employer]

## 2019-08-08 NOTE — Assessment & Plan Note (Signed)
Benjamin Ramirez continues to have low back pain, left worse than right neurogenic claudication and radiculitis. He did not respond much to the prednisone, unfortunately he never did the physical therapy. Switching to home health PT, he is homebound. Continue gabapentin and Cymbalta. Return to see me 4 to 6 weeks after PT, we will likely order an MRI at that time.

## 2019-08-08 NOTE — Assessment & Plan Note (Signed)
Deavin returns, is having worsening knee pain, we injected his knee at the last visit, he returns today with worsening of swelling, pain. I performed an arthrocentesis today, we will send this off for crystal analysis, cell counts and cultures. I am also going to get him approved for Orthovisc. I am hesitant to add any NSAIDs or acetaminophen-containing analgesics particularly with his increasing weight, and fluid retention. He agrees to follow-up with his PCP ASAP for this.

## 2019-08-09 LAB — CELL COUNT AND DIFF, FLUID, OTHER
Basophils, %: 0 %
Eosinophils, %: 0 %
Lymphocytes, %: 74 %
Mesothelial, %: 0 %
Monocyte/Macrophage %: 19 %
Neutrophils, %: 7 %
Total Nucleated Cell Ct: 112 cells/uL

## 2019-08-09 LAB — SYNOVIAL FLUID, CRYSTAL

## 2019-08-18 ENCOUNTER — Other Ambulatory Visit: Payer: Self-pay | Admitting: Physician Assistant

## 2019-08-18 DIAGNOSIS — M4696 Unspecified inflammatory spondylopathy, lumbar region: Secondary | ICD-10-CM

## 2019-08-18 DIAGNOSIS — M48062 Spinal stenosis, lumbar region with neurogenic claudication: Secondary | ICD-10-CM

## 2019-08-18 DIAGNOSIS — M539 Dorsopathy, unspecified: Secondary | ICD-10-CM

## 2019-08-18 NOTE — Telephone Encounter (Signed)
Received benefits investigation detail will submit PA tomorrow. - CF

## 2019-08-18 NOTE — Telephone Encounter (Signed)
Started orthovisc case waiting on benefits determination - CF 

## 2019-08-19 ENCOUNTER — Telehealth: Payer: Self-pay

## 2019-08-19 DIAGNOSIS — M170 Bilateral primary osteoarthritis of knee: Secondary | ICD-10-CM

## 2019-08-19 MED ORDER — TRAMADOL HCL 50 MG PO TABS: 50.0000 mg | ORAL_TABLET | Freq: Three times a day (TID) | ORAL | 0 refills | Status: DC | PRN

## 2019-08-19 NOTE — Telephone Encounter (Addendum)
Benjamin Ramirez scheduled for Monday for an in office visit for Orthovisc. She PA was approved. Benjamin Ramirez wanted to know if he could get something for pain for the weekend.

## 2019-08-19 NOTE — Telephone Encounter (Signed)
No problem, adding some tramadol to get him through the weekend.

## 2019-08-19 NOTE — Telephone Encounter (Signed)
Lets have him do a virtual visit to discuss this in further detail.  Also please check with Arline Asp regarding Orthovisc, this was last updated yesterday after being initiated all the way back on the 5th of April.

## 2019-08-19 NOTE — Telephone Encounter (Addendum)
Benjamin Ramirez and I submitted PA through [employer] and received approval patient is being scheduled for visits. - CF  Auth [id number]

## 2019-08-19 NOTE — Telephone Encounter (Signed)
[name] called and states Benjamin Ramirez is having a lot of knee pain. She wanted to know if he could get some pain medication for his knee. Please advise.

## 2019-08-22 ENCOUNTER — Ambulatory Visit (INDEPENDENT_AMBULATORY_CARE_PROVIDER_SITE_OTHER): Payer: Medicare HMO | Admitting: Sports Medicine

## 2019-08-22 ENCOUNTER — Other Ambulatory Visit: Payer: Self-pay

## 2019-08-22 DIAGNOSIS — M170 Bilateral primary osteoarthritis of knee: Secondary | ICD-10-CM

## 2019-08-22 NOTE — Progress Notes (Signed)
    Procedures performed today:    Procedure: Real-time Ultrasound Guided injection of the right knee Device: Samsung HS60  Verbal informed consent obtained.  Time-out conducted.  Noted no overlying erythema, induration, or other signs of local infection.  Skin prepped in a sterile fashion.  Local anesthesia: Topical Ethyl chloride.  With sterile technique and under real time ultrasound guidance:  30 mg/2 mL of OrthoVisc (sodium hyaluronate) in a prefilled syringe was injected easily into the knee through a 22-gauge needle. Completed without difficulty  Pain immediately resolved suggesting accurate placement of the medication.  Advised to call if fevers/chills, erythema, induration, drainage, or persistent bleeding.  Images permanently stored and available for review in the ultrasound unit.  Impression: Technically successful ultrasound guided injection.  Procedure: Real-time Ultrasound Guided  aspiration/injection of left knee Device: Samsung HS60  Verbal informed consent obtained.  Time-out conducted.  Noted no overlying erythema, induration, or other signs of local infection.  Skin prepped in a sterile fashion.  Local anesthesia: Topical Ethyl chloride.  With sterile technique and under real time ultrasound guidance:  I advanced an 18-gauge needle, aspirated 18 cc of clear, straw-colored fluid, syringe switched and 30 mg/2 mL of OrthoVisc (sodium hyaluronate) in a prefilled syringe was injected easily into the knee Completed without difficulty  Pain immediately resolved suggesting accurate placement of the medication.  Advised to call if fevers/chills, erythema, induration, drainage, or persistent bleeding.  Images permanently stored and available for review in the ultrasound unit.  Impression: Technically successful ultrasound guided injection.  Independent interpretation of notes and tests performed by another provider:   None.  Brief History, Exam, Impression, and  Recommendations:    Primary osteoarthritis of both knees Benjamin Ramirez returns, aspiration and crystal analysis, cultures was negative so we proceeded with Orthovisc approval, he is here today for his first Orthovisc injections. He is in a lot of pain, but he never picked up the tramadol called in last week. Orthovisc No. 1 of 4 into both knees, return in 1 week for #2 of 4 both knees.    ___________________________________________ Benjamin Ramirez. Benjamin Ramirez, M.D., ABFM., CAQSM. Primary Care and Sports Medicine [place] [facility]  Adjunct Instructor of Family Medicine  [employer]

## 2019-08-22 NOTE — Assessment & Plan Note (Addendum)
Gedalia returns, aspiration and crystal analysis, cultures was negative so we proceeded with Orthovisc approval, he is here today for his first Orthovisc injections. He is in a lot of pain, but he never picked up the tramadol called in last week. Orthovisc No. 1 of 4 into both knees, return in 1 week for #2 of 4 both knees.

## 2019-08-29 ENCOUNTER — Ambulatory Visit: Payer: Medicare HMO | Admitting: Sports Medicine

## 2019-08-30 ENCOUNTER — Other Ambulatory Visit: Payer: Self-pay

## 2019-08-30 ENCOUNTER — Telehealth: Payer: Self-pay | Admitting: Physician Assistant

## 2019-08-30 ENCOUNTER — Ambulatory Visit (INDEPENDENT_AMBULATORY_CARE_PROVIDER_SITE_OTHER): Payer: Medicare HMO | Admitting: Sports Medicine

## 2019-08-30 DIAGNOSIS — M170 Bilateral primary osteoarthritis of knee: Secondary | ICD-10-CM | POA: Diagnosis not present

## 2019-08-30 NOTE — Telephone Encounter (Signed)
Patient made aware of this information and an appt made with Christen Butter on 09/01/2019 at 2pm to discuss sleep issues. AM

## 2019-08-30 NOTE — Progress Notes (Signed)
    Procedures performed today:    Procedure: Real-time Ultrasound Guided  aspiration/injection of the left knee Device: Samsung HS60  Verbal informed consent obtained.  Time-out conducted.  Noted no overlying erythema, induration, or other signs of local infection.  Skin prepped in a sterile fashion.  Local anesthesia: Topical Ethyl chloride.  With sterile technique and under real time ultrasound guidance:  Using an 18-gauge needle aspirated approximately 25 mL of clear, straw-colored fluid, syringe switched and 30 mg/2 mL of OrthoVisc (sodium hyaluronate) in a prefilled syringe was injected easily into the knee through an 18-gauge needle.   Completed without difficulty  Pain immediately resolved suggesting accurate placement of the medication.  Advised to call if fevers/chills, erythema, induration, drainage, or persistent bleeding.  Images permanently stored and available for review in the ultrasound unit.  Impression: Technically successful ultrasound guided injection.  Procedure: Real-time Ultrasound Guided aspiration/ injection of the right knee Device: Samsung HS60  Verbal informed consent obtained.  Time-out conducted.  Noted no overlying erythema, induration, or other signs of local infection.  Skin prepped in a sterile fashion.  Local anesthesia: Topical Ethyl chloride.  With sterile technique and under real time ultrasound guidance:  Using an 18-gauge needle aspirated approximately 30 mL of clear, straw-colored fluid, syringe switched and 30 mg/2 mL of OrthoVisc (sodium hyaluronate) in a prefilled syringe was injected easily into the knee through an 18-gauge needle.   Completed without difficulty  Pain immediately resolved suggesting accurate placement of the medication.  Advised to call if fevers/chills, erythema, induration, drainage, or persistent bleeding.  Images permanently stored and available for review in the ultrasound unit.  Impression: Technically successful  ultrasound guided injection.  Independent interpretation of notes and tests performed by another provider:   None.  Brief History, Exam, Impression, and Recommendations:    Primary osteoarthritis of both knees Hassaan returns, we are doing Orthovisc No. 2 of 4 to both knees, return in 1 week for #3 of 4.    ___________________________________________ Ihor Austin. Benjamin Stain, M.D., ABFM., CAQSM. Primary Care and Sports Medicine [place] [facility]  Adjunct Instructor of Family Medicine  [employer]

## 2019-08-30 NOTE — Telephone Encounter (Signed)
Patient's caregiver, Glenford Bayley, was in with patient for an appt with Dr. Darene Lamer for ortho issues and stated that the patient is having sleep problems and was questioning if something could be called in for this. Also states that he received a glucose meter, but [employer] said that a Prodigy Meter Kit is the one that needed to be ordered for them and by the [employer]. AM

## 2019-08-30 NOTE — Assessment & Plan Note (Signed)
Wasif returns, we are doing Orthovisc No. 2 of 4 to both knees, return in 1 week for #3 of 4.

## 2019-08-30 NOTE — Telephone Encounter (Signed)
From phone note on 07/05/2019: Prodigy autocode meter kit denied by insurance. Per Luvenia Starch to order Accu-chek. Order entered.   Looks like he was last seen in February, if having new sleep issues with need appt with any provider to discuss. Thanks.

## 2019-08-31 ENCOUNTER — Other Ambulatory Visit: Payer: Self-pay | Admitting: *Deleted

## 2019-08-31 DIAGNOSIS — M170 Bilateral primary osteoarthritis of knee: Secondary | ICD-10-CM

## 2019-09-01 ENCOUNTER — Telehealth: Payer: Self-pay

## 2019-09-01 ENCOUNTER — Ambulatory Visit: Payer: Medicare HMO | Admitting: Medical-Surgical

## 2019-09-01 DIAGNOSIS — M170 Bilateral primary osteoarthritis of knee: Secondary | ICD-10-CM

## 2019-09-01 MED ORDER — TRAMADOL HCL 50 MG PO TABS: 50.0000 mg | ORAL_TABLET | Freq: Three times a day (TID) | ORAL | 0 refills | Status: DC | PRN

## 2019-09-01 NOTE — Telephone Encounter (Signed)
[name] called and states the Tramadol was not sent to [facility]. I did check the system and it was sent to [facility].

## 2019-09-02 MED ORDER — TRAMADOL HCL 50 MG PO TABS: 50.0000 mg | ORAL_TABLET | Freq: Three times a day (TID) | ORAL | 0 refills | Status: DC | PRN

## 2019-09-02 NOTE — Telephone Encounter (Signed)
I tried to call Benjamin Ramirez back. The phone rings then hangs up.

## 2019-09-02 NOTE — Telephone Encounter (Signed)
Sent to local pharmacy.

## 2019-09-06 ENCOUNTER — Other Ambulatory Visit: Payer: Self-pay

## 2019-09-06 ENCOUNTER — Ambulatory Visit (INDEPENDENT_AMBULATORY_CARE_PROVIDER_SITE_OTHER): Payer: Medicare HMO | Admitting: Sports Medicine

## 2019-09-06 DIAGNOSIS — M170 Bilateral primary osteoarthritis of knee: Secondary | ICD-10-CM | POA: Diagnosis not present

## 2019-09-06 NOTE — Assessment & Plan Note (Signed)
Orthovisc No. 3 of 4 into both knees, return in 1 week for #4 of 4. 

## 2019-09-06 NOTE — Progress Notes (Signed)
    Procedures performed today:    Procedure: Real-time Ultrasound Guided aspiration/injection of the left knee Device: Samsung HS60  Verbal informed consent obtained.  Time-out conducted.  Noted no overlying erythema, induration, or other signs of local infection.  Skin prepped in a sterile fashion.  Local anesthesia: Topical Ethyl chloride.  With sterile technique and under real time ultrasound guidance: Using an 18-gauge needle aspirated approximately 18 mL of clear, straw-colored fluid, syringe switched and 30 mg/2 mL of OrthoVisc (sodium hyaluronate) in a prefilled syringe was injected easily into the knee through an 18-gauge needle.   Completed without difficulty  Pain immediately resolved suggesting accurate placement of the medication.  Advised to call if fevers/chills, erythema, induration, drainage, or persistent bleeding.  Images permanently stored and available for review in the ultrasound unit.  Impression: Technically successful ultrasound guided injection.  Procedure: Real-time Ultrasound Guided aspiration/injection of the right knee Device: Samsung HS60  Verbal informed consent obtained.  Time-out conducted.  Noted no overlying erythema, induration, or other signs of local infection.  Skin prepped in a sterile fashion.  Local anesthesia: Topical Ethyl chloride.  With sterile technique and under real time ultrasound guidance: Using an 18-gauge needle aspirated approximately 27 mL of clear, straw-colored fluid, syringe switched and 30 mg/2 mL of OrthoVisc (sodium hyaluronate) in a prefilled syringe was injected easily into the knee through an 18-gauge needle.   Completed without difficulty  Pain immediately resolved suggesting accurate placement of the medication.  Advised to call if fevers/chills, erythema, induration, drainage, or persistent bleeding.  Images permanently stored and available for review in the ultrasound unit.  Impression: Technically successful  ultrasound guided injection.  Independent interpretation of notes and tests performed by another provider:   None.  Brief History, Exam, Impression, and Recommendations:    Primary osteoarthritis of both knees Orthovisc No. 3 of 4 into both knees, return in 1 week for #4 of 4.    ___________________________________________ Ihor Austin. Benjamin Stain, M.D., ABFM., CAQSM. Primary Care and Sports Medicine [facility]  Adjunct Instructor of Family Medicine  [employer]

## 2019-09-07 LAB — ANAEROBIC AND AEROBIC CULTURE
MICRO NUMBER:: 10327893
MICRO NUMBER:: 10327894
SPECIMEN QUALITY:: ADEQUATE
SPECIMEN QUALITY:: ADEQUATE

## 2019-09-13 ENCOUNTER — Other Ambulatory Visit: Payer: Self-pay

## 2019-09-13 ENCOUNTER — Ambulatory Visit (INDEPENDENT_AMBULATORY_CARE_PROVIDER_SITE_OTHER): Payer: Medicare HMO | Admitting: Sports Medicine

## 2019-09-13 DIAGNOSIS — M170 Bilateral primary osteoarthritis of knee: Secondary | ICD-10-CM | POA: Diagnosis not present

## 2019-09-13 MED ORDER — TRAMADOL HCL 50 MG PO TABS: 50.0000 mg | ORAL_TABLET | Freq: Three times a day (TID) | ORAL | 0 refills | Status: DC | PRN

## 2019-09-13 NOTE — Assessment & Plan Note (Addendum)
Today I performed Orthovisc injection #4 of 4 into both knees, starting to feel some improvement. Because he still has significant pain we have refilled his tramadol for use up to 3 times daily. If Orthovisc fails we will work more on his medical pain management.

## 2019-09-13 NOTE — Progress Notes (Signed)
    Procedures performed today:    Procedure: Real-time Ultrasound Guided injection of the left knee Device: Samsung HS60  Verbal informed consent obtained.  Time-out conducted.  Noted no overlying erythema, induration, or other signs of local infection.  Skin prepped in a sterile fashion.  Local anesthesia: Topical Ethyl chloride.  With sterile technique and under real time ultrasound guidance:  30 mg/2 mL of OrthoVisc (sodium hyaluronate) in a prefilled syringe was injected easily into the knee through a 22-gauge needle.   Completed without difficulty  Pain immediately resolved suggesting accurate placement of the medication.  Advised to call if fevers/chills, erythema, induration, drainage, or persistent bleeding.  Images permanently stored and available for review in the ultrasound unit.  Impression: Technically successful ultrasound guided injection.  Procedure: Real-time Ultrasound Guided aspiration/injection of right knee Device: Samsung HS60  Verbal informed consent obtained.  Time-out conducted.  Noted no overlying erythema, induration, or other signs of local infection.  Skin prepped in a sterile fashion.  Local anesthesia: Topical Ethyl chloride.  With sterile technique and under real time ultrasound guidance:  Using an 18-gauge needle aspirated approximately 20 mL of clear, straw-colored fluid, syringe switched and 30 mg/2 mL of OrthoVisc (sodium hyaluronate) in a prefilled syringe was injected easily into the knee Completed without difficulty  Pain immediately resolved suggesting accurate placement of the medication.  Advised to call if fevers/chills, erythema, induration, drainage, or persistent bleeding.  Images permanently stored and available for review in the ultrasound unit.  Impression: Technically successful ultrasound guided injection.  Independent interpretation of notes and tests performed by another provider:   None.  Brief History, Exam, Impression, and  Recommendations:    Primary osteoarthritis of both knees Today I performed Orthovisc injection #4 of 4 into both knees, starting to feel some improvement. Because he still has significant pain we have refilled his tramadol for use up to 3 times daily. If Orthovisc fails we will work more on his medical pain management.    ___________________________________________ Ihor Austin. Benjamin Stain, M.D., ABFM., CAQSM. Primary Care and Sports Medicine [facility]  Adjunct Instructor of Family Medicine  [employer]

## 2019-09-19 ENCOUNTER — Ambulatory Visit: Payer: Medicare HMO | Admitting: Physician Assistant

## 2019-09-22 ENCOUNTER — Other Ambulatory Visit: Payer: Self-pay | Admitting: Physician Assistant

## 2019-09-22 DIAGNOSIS — I700 Atherosclerosis of aorta: Secondary | ICD-10-CM

## 2019-10-07 ENCOUNTER — Telehealth: Payer: Self-pay | Admitting: Physician Assistant

## 2019-10-07 DIAGNOSIS — G629 Polyneuropathy, unspecified: Secondary | ICD-10-CM

## 2019-10-07 DIAGNOSIS — M4696 Unspecified inflammatory spondylopathy, lumbar region: Secondary | ICD-10-CM

## 2019-10-07 DIAGNOSIS — M539 Dorsopathy, unspecified: Secondary | ICD-10-CM

## 2019-10-07 DIAGNOSIS — M48062 Spinal stenosis, lumbar region with neurogenic claudication: Secondary | ICD-10-CM

## 2019-10-07 DIAGNOSIS — M170 Bilateral primary osteoarthritis of knee: Secondary | ICD-10-CM

## 2019-10-07 MED ORDER — TRAMADOL HCL 50 MG PO TABS: 50.0000 mg | ORAL_TABLET | Freq: Three times a day (TID) | ORAL | 0 refills | Status: DC | PRN

## 2019-10-07 NOTE — Telephone Encounter (Signed)
Done

## 2019-10-07 NOTE — Telephone Encounter (Signed)
LVM and was informed of refill at the pharmacy

## 2019-10-07 NOTE — Telephone Encounter (Signed)
He should have another 90- day refill for gabapentin at pharmacy.

## 2019-10-07 NOTE — Telephone Encounter (Signed)
Needs tramadol refilled from T and Gabapentin refilled by [name]. Pt is completely out of gabapentin. He does have an appt on the 8th and I told them they need to come to continue refills(to avoid cancellations). Please sent these to the [facility] on [address].

## 2019-10-11 ENCOUNTER — Ambulatory Visit: Payer: Medicare HMO | Admitting: Sports Medicine

## 2019-10-11 MED ORDER — GABAPENTIN 300 MG PO CAPS: ORAL_CAPSULE | ORAL | 0 refills | Status: DC

## 2019-10-11 NOTE — Telephone Encounter (Signed)
Thank you :)

## 2019-10-11 NOTE — Addendum Note (Signed)
Addended by: Jed Limerick on: 10/11/2019 09:31 AM   Modules accepted: Orders

## 2019-10-11 NOTE — Telephone Encounter (Signed)
Benjamin Ramirez called inquiring about this again. She states she never reached out to mail order about it but her daughter, Benjamin Ramirez, did. [name] reports there was no refill and that [facility] told her that.    I called [employer] and they have the refill on file. I asked [facility] Benjamin Ramirez to cancel this refill and that I would send to local pharmacy instead.   FYI to PCP

## 2019-11-01 ENCOUNTER — Other Ambulatory Visit: Payer: Self-pay | Admitting: *Deleted

## 2019-11-01 DIAGNOSIS — M170 Bilateral primary osteoarthritis of knee: Secondary | ICD-10-CM

## 2019-11-01 MED ORDER — TRAMADOL HCL 50 MG PO TABS: 50.0000 mg | ORAL_TABLET | Freq: Three times a day (TID) | ORAL | 0 refills | Status: DC | PRN

## 2019-11-24 ENCOUNTER — Other Ambulatory Visit: Payer: Self-pay | Admitting: *Deleted

## 2019-11-24 DIAGNOSIS — M170 Bilateral primary osteoarthritis of knee: Secondary | ICD-10-CM

## 2019-11-25 MED ORDER — TRAMADOL HCL 50 MG PO TABS: 50.0000 mg | ORAL_TABLET | Freq: Three times a day (TID) | ORAL | 0 refills | Status: DC | PRN

## 2019-11-28 ENCOUNTER — Ambulatory Visit: Payer: Medicare HMO | Admitting: Sports Medicine

## 2019-11-28 ENCOUNTER — Encounter: Payer: Self-pay | Admitting: Sports Medicine

## 2019-12-20 ENCOUNTER — Other Ambulatory Visit: Payer: Self-pay | Admitting: Sports Medicine

## 2019-12-20 DIAGNOSIS — M170 Bilateral primary osteoarthritis of knee: Secondary | ICD-10-CM

## 2019-12-21 ENCOUNTER — Telehealth: Payer: Self-pay | Admitting: *Deleted

## 2019-12-21 NOTE — Telephone Encounter (Signed)
Pt's "caregiver" has been leaving very lengthy vms on my line begging Dr. Karie Schwalbe to refill pt's Tramadol.  She stated that the pharmacy told her we denied it yesterday, which we did because Dr. Karie Schwalbe has discharged him due to no-shows.  A letter was sent on 11/28/19.  Letting you know in case you wanted to take over refilling his Tramadol for him.

## 2019-12-22 NOTE — Telephone Encounter (Signed)
Please call patient and offer follow up with [name] if patient wants to have filled here. Thanks!

## 2019-12-22 NOTE — Telephone Encounter (Signed)
Ok to call patient and let him know that Dr. Karie Schwalbe did discharge. He needs appt with me if he wants me to refill his tramadol.

## 2020-01-03 ENCOUNTER — Ambulatory Visit (INDEPENDENT_AMBULATORY_CARE_PROVIDER_SITE_OTHER): Payer: Medicare HMO | Admitting: Physician Assistant

## 2020-01-03 VITALS — BP 154/71 | HR 84 | Ht 64.0 in | Wt 175.0 lb

## 2020-01-03 DIAGNOSIS — G629 Polyneuropathy, unspecified: Secondary | ICD-10-CM

## 2020-01-03 DIAGNOSIS — E1165 Type 2 diabetes mellitus with hyperglycemia: Secondary | ICD-10-CM | POA: Diagnosis not present

## 2020-01-03 DIAGNOSIS — Z0289 Encounter for other administrative examinations: Secondary | ICD-10-CM

## 2020-01-03 DIAGNOSIS — E1159 Type 2 diabetes mellitus with other circulatory complications: Secondary | ICD-10-CM | POA: Diagnosis not present

## 2020-01-03 DIAGNOSIS — M4696 Unspecified inflammatory spondylopathy, lumbar region: Secondary | ICD-10-CM

## 2020-01-03 DIAGNOSIS — M539 Dorsopathy, unspecified: Secondary | ICD-10-CM

## 2020-01-03 DIAGNOSIS — I499 Cardiac arrhythmia, unspecified: Secondary | ICD-10-CM | POA: Diagnosis not present

## 2020-01-03 DIAGNOSIS — F419 Anxiety disorder, unspecified: Secondary | ICD-10-CM

## 2020-01-03 DIAGNOSIS — I700 Atherosclerosis of aorta: Secondary | ICD-10-CM | POA: Diagnosis not present

## 2020-01-03 DIAGNOSIS — I498 Other specified cardiac arrhythmias: Secondary | ICD-10-CM

## 2020-01-03 DIAGNOSIS — M48062 Spinal stenosis, lumbar region with neurogenic claudication: Secondary | ICD-10-CM

## 2020-01-03 DIAGNOSIS — I10 Essential (primary) hypertension: Secondary | ICD-10-CM

## 2020-01-03 DIAGNOSIS — M170 Bilateral primary osteoarthritis of knee: Secondary | ICD-10-CM | POA: Diagnosis not present

## 2020-01-03 LAB — POCT GLYCOSYLATED HEMOGLOBIN (HGB A1C): Hemoglobin A1C: 7.8 % — AB (ref 4.0–5.6)

## 2020-01-03 MED ORDER — GABAPENTIN 300 MG PO CAPS: ORAL_CAPSULE | ORAL | 0 refills | Status: DC

## 2020-01-03 MED ORDER — BUSPIRONE HCL 5 MG PO TABS: 5.0000 mg | ORAL_TABLET | Freq: Three times a day (TID) | ORAL | 1 refills | Status: DC | PRN

## 2020-01-03 MED ORDER — DULOXETINE HCL 60 MG PO CPEP: 60.0000 mg | ORAL_CAPSULE | Freq: Two times a day (BID) | ORAL | 3 refills | Status: DC

## 2020-01-03 MED ORDER — PRODIGY LANCETS 28G MISC: 3 refills | Status: DC

## 2020-01-03 MED ORDER — ATENOLOL 25 MG PO TABS: 25.0000 mg | ORAL_TABLET | Freq: Every day | ORAL | 3 refills | Status: DC

## 2020-01-03 MED ORDER — LINAGLIPTIN 5 MG PO TABS: 5.0000 mg | ORAL_TABLET | Freq: Every day | ORAL | 0 refills | Status: DC

## 2020-01-03 MED ORDER — ATORVASTATIN CALCIUM 10 MG PO TABS: 10.0000 mg | ORAL_TABLET | Freq: Every day | ORAL | 3 refills | Status: DC

## 2020-01-03 MED ORDER — LISINOPRIL 10 MG PO TABS: 10.0000 mg | ORAL_TABLET | Freq: Every day | ORAL | 1 refills | Status: DC

## 2020-01-03 MED ORDER — TRAMADOL HCL 50 MG PO TABS: 50.0000 mg | ORAL_TABLET | Freq: Three times a day (TID) | ORAL | 0 refills | Status: DC | PRN

## 2020-01-03 NOTE — Patient Instructions (Addendum)
Needs eye exam Needs flu shot Stop metformin Start trajenta daily.  Start atenolol.

## 2020-01-03 NOTE — Progress Notes (Signed)
Subjective:    Patient ID: Benjamin Ramirez, male    DOB: 12-10-1934, 85 y.o.   MRN: [id number]  HPI  Pt is a 85 yo male with HTN, T2DM, CKD, HLD, and chronic low back pain due to spinal stenosis who presents to the clinic for medication refills.   Overall no complaints today. He denies any swelling, SOB, headaches, CP, palpitations.   Pt is not checking his sugars. He is trying to limit sugars and carbs. He admits he is not really taking metformin.no open sores or wounds. No hypoglycemia. Needs a new meter.   He continues to be in pain but tramadol does help. He needs refills.   .. Active Ambulatory Problems    Diagnosis Date Noted  . Chronic sinusitis 10/06/2011  . Erectile dysfunction 10/06/2011  . GERD (gastroesophageal reflux disease) 10/06/2011  . Hiatal hernia 10/06/2011  . S/P ERCP 09/22/2011  . Seasonal allergies 10/06/2011  . Spinal stenosis 05/29/2017  . Spondylitis (HCC) 10/06/2011  . History of myocardial infarction   . Hypertension associated with diabetes (HCC) 05/29/2017  . Insomnia secondary to chronic pain 05/29/2017  . Encounter for chronic pain management 05/29/2017  . Neurogenic claudication due to lumbar spinal stenosis 05/29/2017  . Therapeutic opioid induced constipation 05/29/2017  . Diet-controlled diabetes mellitus (HCC) 06/05/2017  . Umbilical hernia without obstruction or gangrene   . Fatty liver   . Hearing difficulty of both ears 06/05/2017  . Peripheral polyneuropathy 06/05/2017  . Aortic atherosclerosis (HCC) 06/05/2017  . Venous stasis dermatitis of both lower extremities 06/05/2017  . Muscle atrophy of lower extremity 06/05/2017  . Onychomycosis due to dermatophyte 06/05/2017  . History of echocardiogram 06/05/2017  . Cardiomegaly 06/05/2017  . Pain management contract signed 06/05/2017  . Mild mitral regurgitation 06/11/2017  . Elevated serum creatinine 02/16/2018  . CKD (chronic kidney disease) stage 3, GFR 30-59 ml/min 06/22/2019  .  Hyperlipidemia LDL goal <70 06/22/2019  . Anxiety 06/22/2019  . Multilevel degenerative disc disease 06/22/2019  . Controlled type 2 diabetes mellitus with complication, without long-term current use of insulin (HCC) 06/22/2019  . Primary osteoarthritis of both knees 06/27/2019  . Irregular heart rhythm 01/03/2020  . Sinus arrhythmia seen on electrocardiogram 01/05/2020   Resolved Ambulatory Problems    Diagnosis Date Noted  . Diabetes mellitus with neurological manifestations, controlled (HCC) 07/26/2014  . Myocardial infarction (HCC) 10/06/2011  . Osteoarthritis 10/06/2011  . Acute cystitis with hematuria 02/16/2018  . Chronic pain of both knees 06/22/2019   Past Medical History:  Diagnosis Date  . Allergy   . Atherosclerosis of aorta (HCC)   . CAD (coronary artery disease)   . Chronic back pain   . Depression   . Diabetes mellitus without complication (HCC)   . Hypertension           Review of Systems    see HPI>  Objective:   Physical Exam Vitals reviewed.  Constitutional:      Appearance: Normal appearance.  HENT:     Head: Normocephalic.  Neck:     Vascular: No carotid bruit.  Cardiovascular:     Rate and Rhythm: Normal rate. Rhythm irregular.     Pulses: Normal pulses.  Pulmonary:     Effort: Pulmonary effort is normal.     Breath sounds: Normal breath sounds.  Musculoskeletal:     Right lower leg: No edema.     Left lower leg: No edema.  Neurological:     General: No focal deficit present.  Mental Status: He is alert.  Psychiatric:        Mood and Affect: Mood normal.        Behavior: Behavior normal.          Assessment & Plan:  Marland KitchenMarland KitchenCarmello was seen today for follow-up.  Diagnoses and all orders for this visit:  Controlled type 2 diabetes mellitus with complication, without long-term current use of insulin (HCC) -     POCT glycosylated hemoglobin (Hb A1C) -     COMPLETE METABOLIC PANEL WITH GFR -     linagliptin (TRADJENTA) 5 MG TABS  tablet; Take 1 tablet (5 mg total) by mouth daily.  Aortic atherosclerosis (HCC) -     atorvastatin (LIPITOR) 10 MG tablet; Take 1 tablet (10 mg total) by mouth daily.  Anxiety -     busPIRone (BUSPAR) 5 MG tablet; Take 1 tablet (5 mg total) by mouth 3 (three) times daily as needed.  Primary osteoarthritis of both knees -     traMADol (ULTRAM) 50 MG tablet; Take 1-2 tablets (50-100 mg total) by mouth every 8 (eight) hours as needed for moderate pain. Maximum 6 tabs per day.  Hypertension associated with diabetes (HCC) -     lisinopril (ZESTRIL) 10 MG tablet; Take 1 tablet (10 mg total) by mouth daily.  Peripheral polyneuropathy -     gabapentin (NEURONTIN) 300 MG capsule; Take one tablet three times a day. -     COMPLETE METABOLIC PANEL WITH GFR -     DULoxetine (CYMBALTA) 60 MG capsule; Take 1 capsule (60 mg total) by mouth 2 (two) times daily.  Multilevel degenerative disc disease -     gabapentin (NEURONTIN) 300 MG capsule; Take one tablet three times a day. -     DULoxetine (CYMBALTA) 60 MG capsule; Take 1 capsule (60 mg total) by mouth 2 (two) times daily.  Neurogenic claudication due to lumbar spinal stenosis -     gabapentin (NEURONTIN) 300 MG capsule; Take one tablet three times a day. -     COMPLETE METABOLIC PANEL WITH GFR -     DULoxetine (CYMBALTA) 60 MG capsule; Take 1 capsule (60 mg total) by mouth 2 (two) times daily.  Inflammatory spondylopathy of lumbar region (HCC) -     gabapentin (NEURONTIN) 300 MG capsule; Take one tablet three times a day. -     DULoxetine (CYMBALTA) 60 MG capsule; Take 1 capsule (60 mg total) by mouth 2 (two) times daily.  Irregular heart rhythm -     COMPLETE METABOLIC PANEL WITH GFR  Pain management contract signed -     DULoxetine (CYMBALTA) 60 MG capsule; Take 1 capsule (60 mg total) by mouth 2 (two) times daily.  Sinus arrhythmia seen on electrocardiogram -     atenolol (TENORMIN) 25 MG tablet; Take 1 tablet (25 mg total) by mouth  daily.  Other orders -     Prodigy Lancets 28G MISC; Use to check glucose once to twice daily PRN for hyperglycemia   .Marland Kitchen Results for orders placed or performed in visit on 01/03/20  POCT glycosylated hemoglobin (Hb A1C)  Result Value Ref Range   Hemoglobin A1C 7.8 (A) 4.0 - 5.6 %   HbA1c POC (<> result, manual entry)     HbA1c, POC (prediabetic range)     HbA1c, POC (controlled diabetic range)     A1C not to goal.  GFR close to 45. Will stop metformin. Add Trajenta. Stressed he needs to take this daily.  On ACE.  BP not to goal. Added atenolol for irregular heart beat.  On statin. LDL 78 almost to goal.  Needs eye exam.  Declined flu shot/covid vaccine.    Irregular rhythm today.  Hx of MI. Seen cardiology years ago.  EKG-sinus arrhythmias.  Last echo 2014 with mild MR and LVH.  Pt is asymptomatic BP up will add low dose atenolol. Follow up in 4 weeks.   Marland KitchenMarland KitchenPDMP reviewed during this encounter. Tramadol refilled today.  Pain contract signed.  Follow up in 3 months.

## 2020-01-05 DIAGNOSIS — I498 Other specified cardiac arrhythmias: Secondary | ICD-10-CM | POA: Insufficient documentation

## 2020-01-10 ENCOUNTER — Encounter: Payer: Self-pay | Admitting: Physician Assistant

## 2020-01-18 ENCOUNTER — Other Ambulatory Visit: Payer: Self-pay

## 2020-01-18 DIAGNOSIS — E1165 Type 2 diabetes mellitus with hyperglycemia: Secondary | ICD-10-CM

## 2020-01-18 MED ORDER — LANCETS MISC. MISC
99 refills | Status: AC
Start: 2020-01-18 — End: ?

## 2020-01-18 MED ORDER — GLUCOSE METER TEST VI STRP: ORAL_STRIP | 12 refills | Status: DC

## 2020-01-18 MED ORDER — FREESTYLE SYSTEM KIT: 1.0000 | PACK | 99 refills | Status: DC | PRN

## 2020-01-18 MED ORDER — BLOOD GLUCOSE METER KIT: PACK | 0 refills | Status: DC

## 2020-01-24 NOTE — Addendum Note (Signed)
Addended by: Chalmers Cater on: 01/24/2020 09:05 AM   Modules accepted: Orders

## 2020-02-13 ENCOUNTER — Other Ambulatory Visit: Payer: Self-pay | Admitting: Neurology

## 2020-02-13 DIAGNOSIS — M170 Bilateral primary osteoarthritis of knee: Secondary | ICD-10-CM

## 2020-02-13 MED ORDER — TRAMADOL HCL 50 MG PO TABS: 50.0000 mg | ORAL_TABLET | Freq: Three times a day (TID) | ORAL | 0 refills | Status: DC | PRN

## 2020-02-13 NOTE — Telephone Encounter (Signed)
VM left requesting refills on Tramadol.  On pain contract. UTD as of August.  Last written 01/03/2020 #90 no refills (30 day supply) Last appt 01/03/2020. RX pended.

## 2020-03-05 ENCOUNTER — Other Ambulatory Visit: Payer: Self-pay

## 2020-03-05 DIAGNOSIS — M170 Bilateral primary osteoarthritis of knee: Secondary | ICD-10-CM

## 2020-03-05 MED ORDER — TRAMADOL HCL 50 MG PO TABS: 50.0000 mg | ORAL_TABLET | Freq: Three times a day (TID) | ORAL | 0 refills | Status: DC | PRN

## 2020-03-05 NOTE — Telephone Encounter (Signed)
Pt's spouse called on behalf of pt. Requesting med refill for tramadol. As per spouse, pt took his last dose this morning. Pt has been taking 2 pills daily due to increased pain. Rx pended.

## 2020-03-23 ENCOUNTER — Other Ambulatory Visit: Payer: Self-pay | Admitting: Neurology

## 2020-03-23 DIAGNOSIS — M170 Bilateral primary osteoarthritis of knee: Secondary | ICD-10-CM

## 2020-03-23 MED ORDER — TRAMADOL HCL 50 MG PO TABS: 50.0000 mg | ORAL_TABLET | Freq: Three times a day (TID) | ORAL | 0 refills | Status: DC | PRN

## 2020-03-23 NOTE — Telephone Encounter (Signed)
Patient's significant other called to get refill on patient's tramadol. Made her aware that it was early to fill this and [name] may not approve. Pain contract states 90 per 30 days. Last written 03/05/2020 #90 no refills. Before that it was written 02/13/2020 #90 no refills. Please advise.

## 2020-03-23 NOTE — Telephone Encounter (Signed)
If he is using more then we need to have an office visit to redo the pain contract. If helping I don't mind adjusting dosing but can't keep taking more without the conversation. Is he completely out? Can he come in next Monday or Tuesday?

## 2020-03-23 NOTE — Telephone Encounter (Signed)
Patient is completely out of medication. Appt made for Monday. Can you send some in for the weekend?

## 2020-03-26 ENCOUNTER — Other Ambulatory Visit: Payer: Self-pay

## 2020-03-26 ENCOUNTER — Encounter: Payer: Self-pay | Admitting: Physician Assistant

## 2020-03-26 ENCOUNTER — Ambulatory Visit (INDEPENDENT_AMBULATORY_CARE_PROVIDER_SITE_OTHER): Payer: Medicare HMO | Admitting: Physician Assistant

## 2020-03-26 VITALS — BP 148/93 | HR 83 | Ht 64.0 in | Wt 173.0 lb

## 2020-03-26 DIAGNOSIS — M25561 Pain in right knee: Secondary | ICD-10-CM | POA: Diagnosis not present

## 2020-03-26 DIAGNOSIS — M170 Bilateral primary osteoarthritis of knee: Secondary | ICD-10-CM

## 2020-03-26 DIAGNOSIS — G8929 Other chronic pain: Secondary | ICD-10-CM

## 2020-03-26 DIAGNOSIS — K219 Gastro-esophageal reflux disease without esophagitis: Secondary | ICD-10-CM

## 2020-03-26 DIAGNOSIS — E1165 Type 2 diabetes mellitus with hyperglycemia: Secondary | ICD-10-CM | POA: Diagnosis not present

## 2020-03-26 DIAGNOSIS — M25562 Pain in left knee: Secondary | ICD-10-CM | POA: Diagnosis not present

## 2020-03-26 DIAGNOSIS — N1831 Chronic kidney disease, stage 3a: Secondary | ICD-10-CM | POA: Diagnosis not present

## 2020-03-26 DIAGNOSIS — Z23 Encounter for immunization: Secondary | ICD-10-CM

## 2020-03-26 DIAGNOSIS — I498 Other specified cardiac arrhythmias: Secondary | ICD-10-CM

## 2020-03-26 DIAGNOSIS — I152 Hypertension secondary to endocrine disorders: Secondary | ICD-10-CM

## 2020-03-26 DIAGNOSIS — F419 Anxiety disorder, unspecified: Secondary | ICD-10-CM | POA: Diagnosis not present

## 2020-03-26 DIAGNOSIS — E118 Type 2 diabetes mellitus with unspecified complications: Secondary | ICD-10-CM

## 2020-03-26 DIAGNOSIS — I700 Atherosclerosis of aorta: Secondary | ICD-10-CM

## 2020-03-26 MED ORDER — HYDROXYZINE PAMOATE 25 MG PO CAPS: ORAL_CAPSULE | ORAL | 2 refills | Status: DC

## 2020-03-26 MED ORDER — GLIPIZIDE ER 5 MG PO TB24: 5.0000 mg | ORAL_TABLET | Freq: Every day | ORAL | 0 refills | Status: DC

## 2020-03-26 MED ORDER — GLUCOSE METER TEST VI STRP: ORAL_STRIP | 12 refills | Status: DC

## 2020-03-26 MED ORDER — ATENOLOL 25 MG PO TABS: 25.0000 mg | ORAL_TABLET | Freq: Every day | ORAL | 3 refills | Status: DC

## 2020-03-26 MED ORDER — TRAMADOL HCL 50 MG PO TABS: 50.0000 mg | ORAL_TABLET | Freq: Three times a day (TID) | ORAL | 0 refills | Status: DC | PRN

## 2020-03-26 NOTE — Patient Instructions (Addendum)
Use the voltaren gel up 4 times a day.  Start glipizide.  Start atenolol.

## 2020-03-26 NOTE — Progress Notes (Signed)
Subjective:    Patient ID: Benjamin Ramirez, male    DOB: 07-08-1934, 85 y.o.   MRN: [id number]  HPI  Pt is a 85 yo male with chronic pain, T2DM, HTN, irregular heart beat who presents to the clinic to discuss needing more pain medication for pain relief.   Pt is on tramadol and needing refills early because using 2 tablets up to three times a day for pain relief. He needs to sign a new pain contract. He cannot take NSAIDs due to kidney function.    Too soon for A1C. Last reading below.   Lab Results  Component Value Date   HGBA1C 7.8 (A) 01/03/2020   He was supposed to stop metformin and start tradjenta but he has not due to cost.   He has not started atenolol for irregular heart beat either.   His anxiety is getting worse. He feels on edge during the day. He took his daughters vistaril and helped a lot. He wonders if he could have some of his on to try.   .. Active Ambulatory Problems    Diagnosis Date Noted  . Chronic sinusitis 10/06/2011  . Erectile dysfunction 10/06/2011  . GERD (gastroesophageal reflux disease) 10/06/2011  . Hiatal hernia 10/06/2011  . S/P ERCP 09/22/2011  . Seasonal allergies 10/06/2011  . Spinal stenosis 05/29/2017  . Spondylitis (HCC) 10/06/2011  . History of myocardial infarction   . Hypertension associated with diabetes (HCC) 05/29/2017  . Insomnia secondary to chronic pain 05/29/2017  . Encounter for chronic pain management 05/29/2017  . Neurogenic claudication due to lumbar spinal stenosis 05/29/2017  . Therapeutic opioid induced constipation 05/29/2017  . Diet-controlled diabetes mellitus (HCC) 06/05/2017  . Umbilical hernia without obstruction or gangrene   . Fatty liver   . Hearing difficulty of both ears 06/05/2017  . Peripheral polyneuropathy 06/05/2017  . Aortic atherosclerosis (HCC) 06/05/2017  . Venous stasis dermatitis of both lower extremities 06/05/2017  . Muscle atrophy of lower extremity 06/05/2017  . Onychomycosis due to  dermatophyte 06/05/2017  . History of echocardiogram 06/05/2017  . Cardiomegaly 06/05/2017  . Pain management contract signed 06/05/2017  . Mild mitral regurgitation 06/11/2017  . Elevated serum creatinine 02/16/2018  . CKD (chronic kidney disease) stage 3, GFR 30-59 ml/min (HCC) 06/22/2019  . Hyperlipidemia LDL goal <70 06/22/2019  . Chronic pain of both knees 06/22/2019  . Anxiety 06/22/2019  . Multilevel degenerative disc disease 06/22/2019  . Controlled type 2 diabetes mellitus with complication, without long-term current use of insulin (HCC) 06/22/2019  . Primary osteoarthritis of both knees 06/27/2019  . Irregular heart rhythm 01/03/2020  . Sinus arrhythmia seen on electrocardiogram 01/05/2020  . Uncontrolled type 2 diabetes mellitus with hyperglycemia (HCC) 04/02/2020   Resolved Ambulatory Problems    Diagnosis Date Noted  . Diabetes mellitus with neurological manifestations, controlled (HCC) 07/26/2014  . Myocardial infarction (HCC) 10/06/2011  . Osteoarthritis 10/06/2011  . Acute cystitis with hematuria 02/16/2018   Past Medical History:  Diagnosis Date  . Allergy   . Atherosclerosis of aorta (HCC)   . CAD (coronary artery disease)   . Chronic back pain   . Depression   . Diabetes mellitus without complication (HCC)   . Hypertension           Review of Systems    see HPI.  Objective:   Physical Exam Vitals reviewed.  Constitutional:      Appearance: Normal appearance.  Neck:     Vascular: No carotid bruit.  Cardiovascular:  Rate and Rhythm: Normal rate. Rhythm irregular.     Pulses: Normal pulses.  Pulmonary:     Effort: Pulmonary effort is normal.     Breath sounds: Normal breath sounds.  Musculoskeletal:     Right lower leg: No edema.     Left lower leg: No edema.  Neurological:     General: No focal deficit present.     Mental Status: He is alert and oriented to person, place, and time.  Psychiatric:        Mood and Affect: Mood normal.            Assessment & Plan:  Marland KitchenMarland KitchenAntoneo was seen today for pain.  Diagnoses and all orders for this visit:  Encounter for chronic pain management -     traMADol (ULTRAM) 50 MG tablet; Take 1-2 tablets (50-100 mg total) by mouth every 8 (eight) hours as needed for moderate pain. Maximum 6 tabs per day.  Stage 3a chronic kidney disease (HCC)  Sinus arrhythmia seen on electrocardiogram -     atenolol (TENORMIN) 25 MG tablet; Take 1 tablet (25 mg total) by mouth daily.  Primary osteoarthritis of both knees -     traMADol (ULTRAM) 50 MG tablet; Take 1-2 tablets (50-100 mg total) by mouth every 8 (eight) hours as needed for moderate pain. Maximum 6 tabs per day.  Uncontrolled type 2 diabetes mellitus with hyperglycemia (HCC) -     glipiZIDE (GLUCOTROL XL) 5 MG 24 hr tablet; Take 1 tablet (5 mg total) by mouth daily with breakfast. -     glucose blood (GLUCOSE METER TEST) test strip; Use as instructed  Flu vaccine need -     Cancel: Flu Vaccine QUAD 36+ mos IM -     Flu Vaccine QUAD High Dose(Fluad)  Chronic pain of both knees -     traMADol (ULTRAM) 50 MG tablet; Take 1-2 tablets (50-100 mg total) by mouth every 8 (eight) hours as needed for moderate pain. Maximum 6 tabs per day.  Anxiety -     hydrOXYzine (VISTARIL) 25 MG capsule; Take one to two tablets as needed for anxiety.   Too soon for A1C. Pt not on any DM medication. Not optimal but due to cost restrictions lets try glipizide to get to goal. Discussed diabetic diet. Follow up in 1 month.  Flu shot given today.   For pain increased to maximum of 6 tablets per day #120. Follow up in 1 month.  ..PDMP reviewed during this encounter.  Signed new pain contract.  Added voltaren gel to use up to 4 times a day.   Start atenolol discussed how affordable it is and to call with any problems.   Discussed anxiety and nerves. Ok to try vistaril. Sedation warning discussed. Follow up in 1 month.

## 2020-04-02 DIAGNOSIS — E1165 Type 2 diabetes mellitus with hyperglycemia: Secondary | ICD-10-CM | POA: Insufficient documentation

## 2020-04-03 ENCOUNTER — Ambulatory Visit: Payer: Medicare HMO | Admitting: Physician Assistant

## 2020-04-25 ENCOUNTER — Other Ambulatory Visit: Payer: Self-pay | Admitting: Physician Assistant

## 2020-04-25 DIAGNOSIS — G8929 Other chronic pain: Secondary | ICD-10-CM

## 2020-04-25 DIAGNOSIS — M170 Bilateral primary osteoarthritis of knee: Secondary | ICD-10-CM

## 2020-04-25 NOTE — Telephone Encounter (Signed)
Last written 03/26/2020 #180 with no refills On pain contract (30 day supply)

## 2020-05-29 ENCOUNTER — Ambulatory Visit (INDEPENDENT_AMBULATORY_CARE_PROVIDER_SITE_OTHER): Payer: Medicare HMO | Admitting: Physician Assistant

## 2020-05-29 ENCOUNTER — Encounter: Payer: Self-pay | Admitting: Physician Assistant

## 2020-05-29 DIAGNOSIS — Z5329 Procedure and treatment not carried out because of patient's decision for other reasons: Secondary | ICD-10-CM

## 2020-05-29 NOTE — Progress Notes (Signed)
No show

## 2020-05-30 ENCOUNTER — Other Ambulatory Visit: Payer: Self-pay | Admitting: Neurology

## 2020-05-30 DIAGNOSIS — I152 Hypertension secondary to endocrine disorders: Secondary | ICD-10-CM

## 2020-05-30 DIAGNOSIS — M170 Bilateral primary osteoarthritis of knee: Secondary | ICD-10-CM

## 2020-05-30 DIAGNOSIS — G8929 Other chronic pain: Secondary | ICD-10-CM

## 2020-05-30 MED ORDER — LISINOPRIL 10 MG PO TABS: 10.0000 mg | ORAL_TABLET | Freq: Every day | ORAL | 0 refills | Status: DC

## 2020-05-30 MED ORDER — TRAMADOL HCL 50 MG PO TABS: ORAL_TABLET | ORAL | 0 refills | Status: DC

## 2020-05-30 NOTE — Telephone Encounter (Signed)
Patient made aware.

## 2020-05-30 NOTE — Telephone Encounter (Signed)
Missed appt yesterday Called back and rescheduled for Monday Needs refills on Tramadol, Lisinopril, and true metric test strips Lisinopril sent, test strips were sent in November with a year of refills Tramadol - last sent 04/25/2020 #180 no refills - please advise.

## 2020-06-04 ENCOUNTER — Ambulatory Visit (INDEPENDENT_AMBULATORY_CARE_PROVIDER_SITE_OTHER): Payer: Medicare Other | Admitting: Physician Assistant

## 2020-06-04 DIAGNOSIS — Z5329 Procedure and treatment not carried out because of patient's decision for other reasons: Secondary | ICD-10-CM

## 2020-06-04 NOTE — Progress Notes (Signed)
No show

## 2020-06-11 ENCOUNTER — Encounter: Payer: Self-pay | Admitting: Physician Assistant

## 2020-06-11 ENCOUNTER — Ambulatory Visit (INDEPENDENT_AMBULATORY_CARE_PROVIDER_SITE_OTHER): Payer: Medicare Other | Admitting: Physician Assistant

## 2020-06-11 ENCOUNTER — Other Ambulatory Visit: Payer: Self-pay

## 2020-06-11 VITALS — BP 143/55 | HR 55 | Ht 64.0 in | Wt 171.0 lb

## 2020-06-11 DIAGNOSIS — Z0289 Encounter for other administrative examinations: Secondary | ICD-10-CM | POA: Diagnosis not present

## 2020-06-11 DIAGNOSIS — M539 Dorsopathy, unspecified: Secondary | ICD-10-CM | POA: Diagnosis not present

## 2020-06-11 DIAGNOSIS — E1165 Type 2 diabetes mellitus with hyperglycemia: Secondary | ICD-10-CM

## 2020-06-11 DIAGNOSIS — G8929 Other chronic pain: Secondary | ICD-10-CM

## 2020-06-11 DIAGNOSIS — E785 Hyperlipidemia, unspecified: Secondary | ICD-10-CM

## 2020-06-11 DIAGNOSIS — M4696 Unspecified inflammatory spondylopathy, lumbar region: Secondary | ICD-10-CM | POA: Diagnosis not present

## 2020-06-11 DIAGNOSIS — G629 Polyneuropathy, unspecified: Secondary | ICD-10-CM

## 2020-06-11 DIAGNOSIS — F411 Generalized anxiety disorder: Secondary | ICD-10-CM

## 2020-06-11 DIAGNOSIS — E11649 Type 2 diabetes mellitus with hypoglycemia without coma: Secondary | ICD-10-CM | POA: Diagnosis not present

## 2020-06-11 DIAGNOSIS — E1159 Type 2 diabetes mellitus with other circulatory complications: Secondary | ICD-10-CM | POA: Diagnosis not present

## 2020-06-11 DIAGNOSIS — I152 Hypertension secondary to endocrine disorders: Secondary | ICD-10-CM

## 2020-06-11 DIAGNOSIS — M25561 Pain in right knee: Secondary | ICD-10-CM | POA: Diagnosis not present

## 2020-06-11 DIAGNOSIS — M48062 Spinal stenosis, lumbar region with neurogenic claudication: Secondary | ICD-10-CM | POA: Diagnosis not present

## 2020-06-11 DIAGNOSIS — I700 Atherosclerosis of aorta: Secondary | ICD-10-CM | POA: Diagnosis not present

## 2020-06-11 DIAGNOSIS — M170 Bilateral primary osteoarthritis of knee: Secondary | ICD-10-CM | POA: Diagnosis not present

## 2020-06-11 DIAGNOSIS — M25562 Pain in left knee: Secondary | ICD-10-CM

## 2020-06-11 LAB — POCT GLYCOSYLATED HEMOGLOBIN (HGB A1C): Hemoglobin A1C: 5.7 % — AB (ref 4.0–5.6)

## 2020-06-11 MED ORDER — LISINOPRIL 10 MG PO TABS: 10.0000 mg | ORAL_TABLET | Freq: Every day | ORAL | 0 refills | Status: DC

## 2020-06-11 MED ORDER — GABAPENTIN 300 MG PO CAPS: ORAL_CAPSULE | ORAL | 0 refills | Status: DC

## 2020-06-11 MED ORDER — TRAMADOL HCL 50 MG PO TABS: ORAL_TABLET | ORAL | 0 refills | Status: DC

## 2020-06-11 MED ORDER — DULOXETINE HCL 60 MG PO CPEP: 60.0000 mg | ORAL_CAPSULE | Freq: Two times a day (BID) | ORAL | 3 refills | Status: DC

## 2020-06-11 MED ORDER — ATORVASTATIN CALCIUM 10 MG PO TABS
10.0000 mg | ORAL_TABLET | Freq: Every day | ORAL | 3 refills | Status: DC
Start: 2020-06-11 — End: 2021-06-24

## 2020-06-11 MED ORDER — HYDROXYZINE HCL 50 MG PO TABS: 50.0000 mg | ORAL_TABLET | Freq: Three times a day (TID) | ORAL | 2 refills | Status: DC | PRN

## 2020-06-11 MED ORDER — GLIPIZIDE ER 5 MG PO TB24: 5.0000 mg | ORAL_TABLET | Freq: Every day | ORAL | 0 refills | Status: DC

## 2020-06-11 NOTE — Progress Notes (Signed)
Subjective:    Patient ID: Benjamin Ramirez, male    DOB: 07-Jan-1935, 86 y.o.   MRN: [id number]  HPI  Pt is a 86 yo male with T2DM, HLD, HTN, CKD-3  Chronic pain and anxiety. He is accompanied by his niece.   Pt is not taking metformin due to the kidney. He is taking glipizide XR. He is doing better with sugar control but if he does not eat he starts to feel bad like his sugars are dropping. He has not been checking his sugars. No open sores or wounds. No CP, palpitations, headaches or vision changes.   His anxiety is increased a bit and when it gets bad his BP spikes up. She wonders if he could increase vistaril.    .. Active Ambulatory Problems    Diagnosis Date Noted  . Chronic sinusitis 10/06/2011  . Erectile dysfunction 10/06/2011  . GERD (gastroesophageal reflux disease) 10/06/2011  . Hiatal hernia 10/06/2011  . S/P ERCP 09/22/2011  . Seasonal allergies 10/06/2011  . Spinal stenosis 05/29/2017  . Spondylitis (HCC) 10/06/2011  . History of myocardial infarction   . Hypertension associated with diabetes (HCC) 05/29/2017  . Insomnia secondary to chronic pain 05/29/2017  . Encounter for chronic pain management 05/29/2017  . Neurogenic claudication due to lumbar spinal stenosis 05/29/2017  . Therapeutic opioid induced constipation 05/29/2017  . Diet-controlled diabetes mellitus (HCC) 06/05/2017  . Umbilical hernia without obstruction or gangrene   . Fatty liver   . Hearing difficulty of both ears 06/05/2017  . Peripheral polyneuropathy 06/05/2017  . Aortic atherosclerosis (HCC) 06/05/2017  . Venous stasis dermatitis of both lower extremities 06/05/2017  . Muscle atrophy of lower extremity 06/05/2017  . Onychomycosis due to dermatophyte 06/05/2017  . History of echocardiogram 06/05/2017  . Cardiomegaly 06/05/2017  . Pain management contract signed 06/05/2017  . Mild mitral regurgitation 06/11/2017  . Elevated serum creatinine 02/16/2018  . CKD (chronic kidney disease) stage 3,  GFR 30-59 ml/min (HCC) 06/22/2019  . Hyperlipidemia LDL goal <70 06/22/2019  . Chronic pain of both knees 06/22/2019  . Anxiety 06/22/2019  . Multilevel degenerative disc disease 06/22/2019  . Controlled type 2 diabetes mellitus with complication, without long-term current use of insulin (HCC) 06/22/2019  . Primary osteoarthritis of both knees 06/27/2019  . Irregular heart rhythm 01/03/2020  . Sinus arrhythmia seen on electrocardiogram 01/05/2020  . Uncontrolled type 2 diabetes mellitus with hyperglycemia (HCC) 04/02/2020  . GAD (generalized anxiety disorder) 06/12/2020   Resolved Ambulatory Problems    Diagnosis Date Noted  . Diabetes mellitus with neurological manifestations, controlled (HCC) 07/26/2014  . Myocardial infarction (HCC) 10/06/2011  . Osteoarthritis 10/06/2011  . Acute cystitis with hematuria 02/16/2018   Past Medical History:  Diagnosis Date  . Allergy   . Atherosclerosis of aorta (HCC)   . CAD (coronary artery disease)   . Chronic back pain   . Depression   . Diabetes mellitus without complication (HCC)   . Hypertension      Review of Systems See HPI.     Objective:   Physical Exam Vitals reviewed.  Constitutional:      Appearance: Normal appearance.  HENT:     Head: Normocephalic.  Neck:     Vascular: No carotid bruit.  Cardiovascular:     Rate and Rhythm: Normal rate and regular rhythm.     Pulses: Normal pulses.     Heart sounds: Normal heart sounds.  Pulmonary:     Effort: Pulmonary effort is normal.  Breath sounds: Normal breath sounds.  Musculoskeletal:     Right lower leg: No edema.     Left lower leg: No edema.  Neurological:     General: No focal deficit present.     Mental Status: He is alert and oriented to person, place, and time.  Psychiatric:        Mood and Affect: Mood normal.      .. Depression screen [facility] 2/9 03/26/2020 05/29/2017  Decreased Interest 0 1  Down, Depressed, Hopeless 0 0  PHQ - 2 Score 0 1  Altered  sleeping 3 3  Tired, decreased energy 2 2  Change in appetite 0 1  Feeling bad or failure about yourself  0 1  Trouble concentrating 0 1  Moving slowly or fidgety/restless 0 1  Suicidal thoughts 0 0  PHQ-9 Score 5 10  Difficult doing work/chores Not difficult at all -   .. GAD 7 : Generalized Anxiety Score 03/26/2020 05/29/2017  Nervous, Anxious, on Edge 1 3  Control/stop worrying 1 3  Worry too much - different things 1 3  Trouble relaxing 1 3  Restless 0 3  Easily annoyed or irritable 2 3  Afraid - awful might happen 0 3  Total GAD 7 Score 6 21  Anxiety Difficulty Somewhat difficult -    .. Lab Results  Component Value Date   HGBA1C 5.7 (A) 06/11/2020        Assessment & Plan:  Marland KitchenMarland KitchenKru was seen today for diabetes.  Diagnoses and all orders for this visit:  Controlled type 2 diabetes mellitus with hypoglycemia, without long-term current use of insulin (HCC) -     POCT glycosylated hemoglobin (Hb A1C) -     Ambulatory referral to Ophthalmology -     COMPLETE METABOLIC PANEL WITH GFR -     Lipid Panel w/reflex Direct LDL -     glipiZIDE (GLUCOTROL XL) 5 MG 24 hr tablet; Take 1 tablet (5 mg total) by mouth daily with breakfast.  Hypertension associated with diabetes (HCC) -     lisinopril (ZESTRIL) 10 MG tablet; Take 1 tablet (10 mg total) by mouth daily.  Peripheral polyneuropathy -     gabapentin (NEURONTIN) 300 MG capsule; Take one tablet three times a day. -     DULoxetine (CYMBALTA) 60 MG capsule; Take 1 capsule (60 mg total) by mouth 2 (two) times daily.  Multilevel degenerative disc disease -     gabapentin (NEURONTIN) 300 MG capsule; Take one tablet three times a day. -     DULoxetine (CYMBALTA) 60 MG capsule; Take 1 capsule (60 mg total) by mouth 2 (two) times daily.  Neurogenic claudication due to lumbar spinal stenosis -     gabapentin (NEURONTIN) 300 MG capsule; Take one tablet three times a day. -     DULoxetine (CYMBALTA) 60 MG capsule; Take 1  capsule (60 mg total) by mouth 2 (two) times daily.  Inflammatory spondylopathy of lumbar region (HCC) -     gabapentin (NEURONTIN) 300 MG capsule; Take one tablet three times a day. -     DULoxetine (CYMBALTA) 60 MG capsule; Take 1 capsule (60 mg total) by mouth 2 (two) times daily.  Aortic atherosclerosis (HCC) -     atorvastatin (LIPITOR) 10 MG tablet; Take 1 tablet (10 mg total) by mouth daily.  Pain management contract signed -     DULoxetine (CYMBALTA) 60 MG capsule; Take 1 capsule (60 mg total) by mouth 2 (two) times daily.  Primary  osteoarthritis of both knees -     traMADol (ULTRAM) 50 MG tablet; TAKE 1 TO 2 TABLETS(50 TO 100 MG) BY MOUTH EVERY 8 HOURS AS NEEDED FOR MODERATE PAIN. MAXIMUM 6 TABLETS DAILY  Encounter for chronic pain management -     traMADol (ULTRAM) 50 MG tablet; TAKE 1 TO 2 TABLETS(50 TO 100 MG) BY MOUTH EVERY 8 HOURS AS NEEDED FOR MODERATE PAIN. MAXIMUM 6 TABLETS DAILY  Chronic pain of both knees -     traMADol (ULTRAM) 50 MG tablet; TAKE 1 TO 2 TABLETS(50 TO 100 MG) BY MOUTH EVERY 8 HOURS AS NEEDED FOR MODERATE PAIN. MAXIMUM 6 TABLETS DAILY  Hyperlipidemia LDL goal <70 -     Lipid Panel w/reflex Direct LDL  GAD (generalized anxiety disorder) -     hydrOXYzine (ATARAX/VISTARIL) 50 MG tablet; Take 1 tablet (50 mg total) by mouth 3 (three) times daily as needed.   Marland KitchenMarland KitchenPDMP reviewed during this encounter. Tramadol refilled. Ok for 3 months.  UTD pain contract and UDS.   Needs labs to check cholesterol. Ordered today.   Discussed anxiety. Increased vistaril to 50mg  up to three times a day.   A1C to goal.  Stay on glipizide.  Next time feeling like sugar is going low check. Make sure eating small frequent meals throughout the day.  On ACE. BP not to goal.  On STATIN.  Need eye exam. Made referral.  Declined foot exam.  Pt declined covid vaccine and aware of risk.  Flu and pneumonia UTD.

## 2020-06-12 DIAGNOSIS — F411 Generalized anxiety disorder: Secondary | ICD-10-CM | POA: Insufficient documentation

## 2020-06-29 ENCOUNTER — Telehealth: Payer: Self-pay | Admitting: Physician Assistant

## 2020-06-29 NOTE — Telephone Encounter (Signed)
Ok thanks 

## 2020-06-29 NOTE — Telephone Encounter (Signed)
Patient's spouse called and said that they have not sent a new [facility] insurance card for patient yet, have not received one through the mail, and she is waiting for that card to come in before patient can get his blood work done and she wanted this information passed along to PCP. AM

## 2020-07-06 ENCOUNTER — Telehealth: Payer: Self-pay | Admitting: Neurology

## 2020-07-06 NOTE — Telephone Encounter (Signed)
Received vm from patient's significant other that he needs refills on Metformin sent to [facility] in [place].   After reviewing chart, patient should not be on Metformin. Tried to call patient to let him/her know and there was no answer, vm full.

## 2020-07-27 ENCOUNTER — Other Ambulatory Visit: Payer: Self-pay

## 2020-07-27 DIAGNOSIS — E11649 Type 2 diabetes mellitus with hypoglycemia without coma: Secondary | ICD-10-CM

## 2020-07-27 MED ORDER — TRUE METRIX BLOOD GLUCOSE TEST VI STRP: ORAL_STRIP | 12 refills | Status: DC

## 2020-07-27 NOTE — Telephone Encounter (Signed)
[name] called and left a message stating Benjamin Ramirez needs a refill on Metformin and test strips. I sent in the test strips. However, patient should not be taking Metformin. I called and left a message requesting a call back.

## 2020-07-31 ENCOUNTER — Ambulatory Visit (INDEPENDENT_AMBULATORY_CARE_PROVIDER_SITE_OTHER): Payer: Medicare Other | Admitting: Physician Assistant

## 2020-07-31 VITALS — BP 149/68 | HR 63 | Ht 64.0 in | Wt 170.0 lb

## 2020-07-31 DIAGNOSIS — G4701 Insomnia due to medical condition: Secondary | ICD-10-CM

## 2020-07-31 DIAGNOSIS — E1165 Type 2 diabetes mellitus with hyperglycemia: Secondary | ICD-10-CM | POA: Diagnosis not present

## 2020-07-31 DIAGNOSIS — G8929 Other chronic pain: Secondary | ICD-10-CM | POA: Diagnosis not present

## 2020-07-31 DIAGNOSIS — R351 Nocturia: Secondary | ICD-10-CM | POA: Diagnosis not present

## 2020-07-31 DIAGNOSIS — E785 Hyperlipidemia, unspecified: Secondary | ICD-10-CM | POA: Diagnosis not present

## 2020-07-31 MED ORDER — TAMSULOSIN HCL 0.4 MG PO CAPS: 0.4000 mg | ORAL_CAPSULE | Freq: Every day | ORAL | 1 refills | Status: DC

## 2020-07-31 MED ORDER — MIRTAZAPINE 7.5 MG PO TABS: ORAL_TABLET | ORAL | 1 refills | Status: DC

## 2020-07-31 NOTE — Progress Notes (Signed)
Subjective:    Patient ID: Benjamin Ramirez, male    DOB: 1935-04-28, 86 y.o.   MRN: [id number]  HPI  Pt is a 86 yo male with HTN, T2DM, Aortic atherosclerosis, chronic low back pain, CKD who presents to the clinic with his daughter to discuss sleep.   He is having more and more trouble sleeping both going and staying asleep. Report he sleeps maybe once every 3 days. He is taking melatonoin 10mg  and aswaganda which at times helps. Pt reports pain and having to get up to urinate does effect his sleep. He has used daughters Ambien and helps a lot. He has not tried any other medication options.   .. Active Ambulatory Problems    Diagnosis Date Noted  . Chronic sinusitis 10/06/2011  . Erectile dysfunction 10/06/2011  . GERD (gastroesophageal reflux disease) 10/06/2011  . Hiatal hernia 10/06/2011  . S/P ERCP 09/22/2011  . Seasonal allergies 10/06/2011  . Spinal stenosis 05/29/2017  . Spondylitis (HCC) 10/06/2011  . History of myocardial infarction   . Hypertension associated with diabetes (HCC) 05/29/2017  . Insomnia secondary to chronic pain 05/29/2017  . Encounter for chronic pain management 05/29/2017  . Neurogenic claudication due to lumbar spinal stenosis 05/29/2017  . Therapeutic opioid induced constipation 05/29/2017  . Diet-controlled diabetes mellitus (HCC) 06/05/2017  . Umbilical hernia without obstruction or gangrene   . Fatty liver   . Hearing difficulty of both ears 06/05/2017  . Peripheral polyneuropathy 06/05/2017  . Aortic atherosclerosis (HCC) 06/05/2017  . Venous stasis dermatitis of both lower extremities 06/05/2017  . Muscle atrophy of lower extremity 06/05/2017  . Onychomycosis due to dermatophyte 06/05/2017  . History of echocardiogram 06/05/2017  . Cardiomegaly 06/05/2017  . Pain management contract signed 06/05/2017  . Mild mitral regurgitation 06/11/2017  . Elevated serum creatinine 02/16/2018  . CKD (chronic kidney disease) stage 3, GFR 30-59 ml/min (HCC)  06/22/2019  . Hyperlipidemia LDL goal <70 06/22/2019  . Chronic pain of both knees 06/22/2019  . Anxiety 06/22/2019  . Multilevel degenerative disc disease 06/22/2019  . Controlled type 2 diabetes mellitus with complication, without long-term current use of insulin (HCC) 06/22/2019  . Primary osteoarthritis of both knees 06/27/2019  . Irregular heart rhythm 01/03/2020  . Sinus arrhythmia seen on electrocardiogram 01/05/2020  . Uncontrolled type 2 diabetes mellitus with hyperglycemia (HCC) 04/02/2020  . GAD (generalized anxiety disorder) 06/12/2020   Resolved Ambulatory Problems    Diagnosis Date Noted  . Diabetes mellitus with neurological manifestations, controlled (HCC) 07/26/2014  . Myocardial infarction (HCC) 10/06/2011  . Osteoarthritis 10/06/2011  . Acute cystitis with hematuria 02/16/2018   Past Medical History:  Diagnosis Date  . Allergy   . Atherosclerosis of aorta (HCC)   . CAD (coronary artery disease)   . Chronic back pain   . Depression   . Diabetes mellitus without complication (HCC)   . Hypertension      Review of Systems See HPI.     Objective:   Physical Exam Vitals reviewed.  Constitutional:      Appearance: Normal appearance.  Cardiovascular:     Rate and Rhythm: Normal rate.     Pulses: Normal pulses.  Pulmonary:     Effort: Pulmonary effort is normal.  Neurological:     General: No focal deficit present.     Mental Status: He is alert and oriented to person, place, and time.  Psychiatric:        Mood and Affect: Mood normal.  Assessment & Plan:  Marland KitchenMarland KitchenShneur was seen today for insomnia and hypertension.  Diagnoses and all orders for this visit:  Insomnia secondary to chronic pain -     mirtazapine (REMERON) 7.5 MG tablet; Take one to two tablets at bedtime for sleep.  Nocturia -     tamsulosin (FLOMAX) 0.4 MG CAPS capsule; Take 1 capsule (0.4 mg total) by mouth daily.    Started mirtazapine at bedtime. On pain contract.    Consulted pharmacist, Benjamin Ramirez in office on medication for insomnia due to multiple serotonin syndrome risk.   Added flomax for nocturia. Follow up in 1 month.    Spent 30 minutes with patient discussing sleep hygiene, medications, pain.

## 2020-07-31 NOTE — Patient Instructions (Signed)
Start flomax for prostate and remeron for sleep.    Insomnia Insomnia is a sleep disorder that makes it difficult to fall asleep or stay asleep. Insomnia can cause fatigue, low energy, difficulty concentrating, mood swings, and poor performance at work or school. There are three different ways to classify insomnia:  Difficulty falling asleep.  Difficulty staying asleep.  Waking up too early in the morning. Any type of insomnia can be long-term (chronic) or short-term (acute). Both are common. Short-term insomnia usually lasts for three months or less. Chronic insomnia occurs at least three times a week for longer than three months. What are the causes? Insomnia may be caused by another condition, situation, or substance, such as:  Anxiety.  Certain medicines.  Gastroesophageal reflux disease (GERD) or other gastrointestinal conditions.  Asthma or other breathing conditions.  Restless legs syndrome, sleep apnea, or other sleep disorders.  Chronic pain.  Menopause.  Stroke.  Abuse of alcohol, tobacco, or illegal drugs.  Mental health conditions, such as depression.  Caffeine.  Neurological disorders, such as Alzheimer's disease.  An overactive thyroid (hyperthyroidism). Sometimes, the cause of insomnia may not be known. What increases the risk? Risk factors for insomnia include:  Gender. Women are affected more often than men.  Age. Insomnia is more common as you get older.  Stress.  Lack of exercise.  Irregular work schedule or working night shifts.  Traveling between different time zones.  Certain medical and mental health conditions. What are the signs or symptoms? If you have insomnia, the main symptom is having trouble falling asleep or having trouble staying asleep. This may lead to other symptoms, such as:  Feeling fatigued or having low energy.  Feeling nervous about going to sleep.  Not feeling rested in the morning.  Having trouble  concentrating.  Feeling irritable, anxious, or depressed. How is this diagnosed? This condition may be diagnosed based on:  Your symptoms and medical history. Your health care provider may ask about: ? Your sleep habits. ? Any medical conditions you have. ? Your mental health.  A physical exam. How is this treated? Treatment for insomnia depends on the cause. Treatment may focus on treating an underlying condition that is causing insomnia. Treatment may also include:  Medicines to help you sleep.  Counseling or therapy.  Lifestyle adjustments to help you sleep better. Follow these instructions at home: Eating and drinking  Limit or avoid alcohol, caffeinated beverages, and cigarettes, especially close to bedtime. These can disrupt your sleep.  Do not eat a large meal or eat spicy foods right before bedtime. This can lead to digestive discomfort that can make it hard for you to sleep.   Sleep habits  Keep a sleep diary to help you and your health care provider figure out what could be causing your insomnia. Write down: ? When you sleep. ? When you wake up during the night. ? How well you sleep. ? How rested you feel the next day. ? Any side effects of medicines you are taking. ? What you eat and drink.  Make your bedroom a dark, comfortable place where it is easy to fall asleep. ? Put up shades or blackout curtains to block light from outside. ? Use a white noise machine to block noise. ? Keep the temperature cool.  Limit screen use before bedtime. This includes: ? Watching TV. ? Using your smartphone, tablet, or computer.  Stick to a routine that includes going to bed and waking up at the same times every  day and night. This can help you fall asleep faster. Consider making a quiet activity, such as reading, part of your nighttime routine.  Try to avoid taking naps during the day so that you sleep better at night.  Get out of bed if you are still awake after 15 minutes  of trying to sleep. Keep the lights down, but try reading or doing a quiet activity. When you feel sleepy, go back to bed.   General instructions  Take over-the-counter and prescription medicines only as told by your health care provider.  Exercise regularly, as told by your health care provider. Avoid exercise starting several hours before bedtime.  Use relaxation techniques to manage stress. Ask your health care provider to suggest some techniques that may work well for you. These may include: ? Breathing exercises. ? Routines to release muscle tension. ? Visualizing peaceful scenes.  Make sure that you drive carefully. Avoid driving if you feel very sleepy.  Keep all follow-up visits as told by your health care provider. This is important. Contact a health care provider if:  You are tired throughout the day.  You have trouble in your daily routine due to sleepiness.  You continue to have sleep problems, or your sleep problems get worse. Get help right away if:  You have serious thoughts about hurting yourself or someone else. If you ever feel like you may hurt yourself or others, or have thoughts about taking your own life, get help right away. You can go to your nearest emergency department or call:  Your local emergency services (911 in the [place]  A suicide crisis helpline, such as the National Suicide Prevention Lifeline at 480-873-6104. This is open 24 hours a day. Summary  Insomnia is a sleep disorder that makes it difficult to fall asleep or stay asleep.  Insomnia can be long-term (chronic) or short-term (acute).  Treatment for insomnia depends on the cause. Treatment may focus on treating an underlying condition that is causing insomnia.  Keep a sleep diary to help you and your health care provider figure out what could be causing your insomnia. This information is not intended to replace advice given to you by your health care provider. Make sure you discuss any  questions you have with your health care provider. Document Revised: 03/01/2020 Document Reviewed: 03/01/2020 Elsevier Patient Education  2021 [employer].

## 2020-08-01 ENCOUNTER — Encounter: Payer: Self-pay | Admitting: Physician Assistant

## 2020-08-01 LAB — COMPLETE METABOLIC PANEL WITH GFR
AG Ratio: 1.5 (calc) (ref 1.0–2.5)
ALT: 22 U/L (ref 9–46)
AST: 21 U/L (ref 10–35)
Albumin: 4.3 g/dL (ref 3.6–5.1)
Alkaline phosphatase (APISO): 55 U/L (ref 35–144)
BUN/Creatinine Ratio: 21 (calc) (ref 6–22)
BUN: 26 mg/dL — ABNORMAL HIGH (ref 7–25)
CO2: 25 mmol/L (ref 20–32)
Calcium: 9.3 mg/dL (ref 8.6–10.3)
Chloride: 105 mmol/L (ref 98–110)
Creat: 1.22 mg/dL — ABNORMAL HIGH (ref 0.70–1.11)
GFR, Est African American: 63 mL/min/{1.73_m2} (ref >=60)
GFR, Est Non African American: 54 mL/min/{1.73_m2} — ABNORMAL LOW (ref >=60)
Globulin: 2.9 g/dL (calc) (ref 1.9–3.7)
Glucose, Bld: 108 mg/dL — ABNORMAL HIGH (ref 65–99)
Potassium: 4.1 mmol/L (ref 3.5–5.3)
Sodium: 140 mmol/L (ref 135–146)
Total Bilirubin: 0.5 mg/dL (ref 0.2–1.2)
Total Protein: 7.2 g/dL (ref 6.1–8.1)

## 2020-08-01 LAB — LIPID PANEL W/REFLEX DIRECT LDL
Cholesterol: 127 mg/dL (ref <200)
HDL: 44 mg/dL (ref >=40)
LDL Cholesterol (Calc): 69 mg/dL (calc)
Non-HDL Cholesterol (Calc): 83 mg/dL (calc) (ref <130)
Total CHOL/HDL Ratio: 2.9 (calc) (ref <5.0)
Triglycerides: 68 mg/dL (ref <150)

## 2020-08-01 NOTE — Progress Notes (Signed)
Kaien,   Glucose random better than has been.  Kidney function better than 1 year ago.  Cholesterol GREAT.

## 2020-08-06 ENCOUNTER — Other Ambulatory Visit: Payer: Self-pay | Admitting: Physician Assistant

## 2020-08-06 DIAGNOSIS — G8929 Other chronic pain: Secondary | ICD-10-CM

## 2020-08-06 DIAGNOSIS — M25561 Pain in right knee: Secondary | ICD-10-CM

## 2020-08-06 DIAGNOSIS — M170 Bilateral primary osteoarthritis of knee: Secondary | ICD-10-CM

## 2020-08-06 NOTE — Telephone Encounter (Signed)
Last written 06/28/2020 #180 no refills.  Last appt 07/31/2020. On pain contract.

## 2020-08-13 ENCOUNTER — Telehealth: Payer: Self-pay

## 2020-08-13 DIAGNOSIS — G4701 Insomnia due to medical condition: Secondary | ICD-10-CM

## 2020-08-13 NOTE — Telephone Encounter (Signed)
[name] states the medication he was given for insomnia is not helping. She states he needs a different medication. She states Ambien 5 mg works the best for him. She states she gives him her Ambien. She states he doesn't sleep for days and then will crash and sleep for a whole day. Please advise.

## 2020-08-14 NOTE — Telephone Encounter (Signed)
Left message advising of recommendations.  

## 2020-08-14 NOTE — Telephone Encounter (Signed)
Try doubling first before stopping all together.

## 2020-09-09 ENCOUNTER — Other Ambulatory Visit: Payer: Self-pay | Admitting: Physician Assistant

## 2020-09-09 DIAGNOSIS — M48062 Spinal stenosis, lumbar region with neurogenic claudication: Secondary | ICD-10-CM

## 2020-09-09 DIAGNOSIS — M4696 Unspecified inflammatory spondylopathy, lumbar region: Secondary | ICD-10-CM

## 2020-09-09 DIAGNOSIS — G629 Polyneuropathy, unspecified: Secondary | ICD-10-CM

## 2020-09-09 DIAGNOSIS — M539 Dorsopathy, unspecified: Secondary | ICD-10-CM

## 2020-09-09 DIAGNOSIS — E11649 Type 2 diabetes mellitus with hypoglycemia without coma: Secondary | ICD-10-CM

## 2020-09-10 ENCOUNTER — Encounter: Payer: Self-pay | Admitting: Physician Assistant

## 2020-09-10 ENCOUNTER — Ambulatory Visit (INDEPENDENT_AMBULATORY_CARE_PROVIDER_SITE_OTHER): Payer: Medicare Other | Admitting: Physician Assistant

## 2020-09-10 ENCOUNTER — Other Ambulatory Visit: Payer: Self-pay

## 2020-09-10 VITALS — BP 123/60 | HR 61 | Temp 98.0°F | Resp 20 | Ht 64.0 in | Wt 171.0 lb

## 2020-09-10 DIAGNOSIS — M25561 Pain in right knee: Secondary | ICD-10-CM | POA: Diagnosis not present

## 2020-09-10 DIAGNOSIS — R109 Unspecified abdominal pain: Secondary | ICD-10-CM

## 2020-09-10 DIAGNOSIS — M25562 Pain in left knee: Secondary | ICD-10-CM | POA: Diagnosis not present

## 2020-09-10 DIAGNOSIS — G8929 Other chronic pain: Secondary | ICD-10-CM | POA: Diagnosis not present

## 2020-09-10 DIAGNOSIS — M170 Bilateral primary osteoarthritis of knee: Secondary | ICD-10-CM

## 2020-09-10 DIAGNOSIS — E11649 Type 2 diabetes mellitus with hypoglycemia without coma: Secondary | ICD-10-CM | POA: Diagnosis not present

## 2020-09-10 DIAGNOSIS — G629 Polyneuropathy, unspecified: Secondary | ICD-10-CM | POA: Diagnosis not present

## 2020-09-10 DIAGNOSIS — G4701 Insomnia due to medical condition: Secondary | ICD-10-CM

## 2020-09-10 LAB — POCT GLYCOSYLATED HEMOGLOBIN (HGB A1C): Hemoglobin A1C: 6 % — AB (ref 4.0–5.6)

## 2020-09-10 LAB — POCT URINALYSIS DIP (CLINITEK)
Blood, UA: NEGATIVE
Glucose, UA: NEGATIVE mg/dL
Leukocytes, UA: NEGATIVE
Nitrite, UA: NEGATIVE
POC PROTEIN,UA: NEGATIVE
Spec Grav, UA: 1.025 (ref 1.010–1.025)
Urobilinogen, UA: 1 E.U./dL
pH, UA: 5.5 (ref 5.0–8.0)

## 2020-09-10 MED ORDER — GLIPIZIDE ER 5 MG PO TB24
5.0000 mg | ORAL_TABLET | Freq: Every day | ORAL | 0 refills | Status: DC
Start: 2020-09-10 — End: 2021-01-09

## 2020-09-10 MED ORDER — GABAPENTIN 600 MG PO TABS: ORAL_TABLET | ORAL | 0 refills | Status: DC

## 2020-09-10 MED ORDER — MIRTAZAPINE 30 MG PO TABS: 30.0000 mg | ORAL_TABLET | Freq: Every day | ORAL | 0 refills | Status: DC

## 2020-09-10 NOTE — Patient Instructions (Addendum)
Increase gabapentin to 600mg  up to three times a day.  Continue glipizide.  Increased remeron 30 mg.

## 2020-09-10 NOTE — Progress Notes (Deleted)
   Subjective:    Patient ID: Benjamin Ramirez, male    DOB: 02-28-1935, 86 y.o.   MRN: [id number]  HPI    Review of Systems     Objective:   Physical Exam        Assessment & Plan:

## 2020-09-10 NOTE — Progress Notes (Addendum)
Subjective:    Patient ID: Benjamin Ramirez, male    DOB: [birth year], 86 y.o.   MRN: [id number]  HPI  Pt is a 86 yo male with Type 2 DM, hyperlipidemia, HTN, T2DM, HLD, HTN, CKD-3, chronic low pain and anxiety.  Patient has been taking glipizide as prescribed and has had glucose readings of 90s in the morning. He has had some hypoglycemic dips into the 70s which usually cause him to feel tired and dizzy.   The neuropathy in his feet has been worsening and he complains of numbness, tingling, and shooting pain. He does not usually have tingling in his hands.   He continues to have low back pain with no radiation. The tramadol has helped with his pain, but he tries to use it sparingly.  At night, his mind is racing and he has trouble sleeping. He has tried Remeron, but did not notice a difference. He is not sure if he took the full dose.  .. Active Ambulatory Problems    Diagnosis Date Noted  . Chronic sinusitis 10/06/2011  . Erectile dysfunction 10/06/2011  . GERD (gastroesophageal reflux disease) 10/06/2011  . Hiatal hernia 10/06/2011  . S/P ERCP 09/22/2011  . Seasonal allergies 10/06/2011  . Spinal stenosis 05/29/2017  . Spondylitis (HCC) 10/06/2011  . History of myocardial infarction   . Hypertension associated with diabetes (HCC) 05/29/2017  . Insomnia secondary to chronic pain 05/29/2017  . Encounter for chronic pain management 05/29/2017  . Neurogenic claudication due to lumbar spinal stenosis 05/29/2017  . Therapeutic opioid induced constipation 05/29/2017  . Diet-controlled diabetes mellitus (HCC) 06/05/2017  . Umbilical hernia without obstruction or gangrene   . Fatty liver   . Hearing difficulty of both ears 06/05/2017  . Peripheral polyneuropathy 06/05/2017  . Aortic atherosclerosis (HCC) 06/05/2017  . Venous stasis dermatitis of both lower extremities 06/05/2017  . Muscle atrophy of lower extremity 06/05/2017  . Onychomycosis due to dermatophyte 06/05/2017  . History of  echocardiogram 06/05/2017  . Cardiomegaly 06/05/2017  . Pain management contract signed 06/05/2017  . Mild mitral regurgitation 06/11/2017  . Elevated serum creatinine 02/16/2018  . CKD (chronic kidney disease) stage 3, GFR 30-59 ml/min (HCC) 06/22/2019  . Hyperlipidemia LDL goal <70 06/22/2019  . Chronic pain of both knees 06/22/2019  . Anxiety 06/22/2019  . Multilevel degenerative disc disease 06/22/2019  . Controlled type 2 diabetes mellitus with complication, without long-term current use of insulin (HCC) 06/22/2019  . Primary osteoarthritis of both knees 06/27/2019  . Irregular heart rhythm 01/03/2020  . Sinus arrhythmia seen on electrocardiogram 01/05/2020  . Uncontrolled type 2 diabetes mellitus with hyperglycemia (HCC) 04/02/2020  . GAD (generalized anxiety disorder) 06/12/2020   Resolved Ambulatory Problems    Diagnosis Date Noted  . Diabetes mellitus with neurological manifestations, controlled (HCC) 07/26/2014  . Myocardial infarction (HCC) 10/06/2011  . Osteoarthritis 10/06/2011  . Acute cystitis with hematuria 02/16/2018   Past Medical History:  Diagnosis Date  . Allergy   . Atherosclerosis of aorta (HCC)   . CAD (coronary artery disease)   . Chronic back pain   . Depression   . Diabetes mellitus without complication (HCC)   . Hypertension      Review of Systems See HPI.     Objective:   Physical Exam Vitals reviewed.  Constitutional:      Appearance: Normal appearance.  HENT:     Head: Normocephalic.  Neck:     Vascular: No carotid bruit.  Cardiovascular:     Rate  and Rhythm: Normal rate and regular rhythm.     Pulses: Normal pulses.     Heart sounds: Normal heart sounds.  Pulmonary:     Effort: Pulmonary effort is normal.     Breath sounds: Normal breath sounds.  Musculoskeletal:     Right lower leg: No edema.     Left lower leg: No edema.  Neurological:     General: No focal deficit present.     Mental Status: He is alert and oriented to  person, place, and time.  Psychiatric:        Mood and Affect: Mood normal.      .. Depression screen [facility] 2/9 03/26/2020 05/29/2017  Decreased Interest 0 1  Down, Depressed, Hopeless 0 0  PHQ - 2 Score 0 1  Altered sleeping 3 3  Tired, decreased energy 2 2  Change in appetite 0 1  Feeling bad or failure about yourself  0 1  Trouble concentrating 0 1  Moving slowly or fidgety/restless 0 1  Suicidal thoughts 0 0  PHQ-9 Score 5 10  Difficult doing work/chores Not difficult at all -   .. GAD 7 : Generalized Anxiety Score 03/26/2020 05/29/2017  Nervous, Anxious, on Edge 1 3  Control/stop worrying 1 3  Worry too much - different things 1 3  Trouble relaxing 1 3  Restless 0 3  Easily annoyed or irritable 2 3  Afraid - awful might happen 0 3  Total GAD 7 Score 6 21  Anxiety Difficulty Somewhat difficult -    .. Lab Results  Component Value Date   HGBA1C 6.0 (A) 09/10/2020        Assessment & Plan:  Marland KitchenMarland KitchenPeyton was seen today for follow-up.  Diagnoses and all orders for this visit:  Controlled type 2 diabetes mellitus with hypoglycemia, without long-term current use of insulin (HCC) -     POCT glycosylated hemoglobin (Hb A1C) -     glipiZIDE (GLUCOTROL XL) 5 MG 24 hr tablet; Take 1 tablet (5 mg total) by mouth daily with breakfast.  Left flank pain -     POCT URINALYSIS DIP (CLINITEK)  Encounter for chronic pain management -     traMADol (ULTRAM) 50 MG tablet; TAKE 1 TO 2 TABLETS(50 TO 100 MG) BY MOUTH EVERY 8 HOURS AS NEEDED FOR MODERATE PAIN. MAXIMUM 6 TABLETS DAILY  Insomnia secondary to chronic pain -     mirtazapine (REMERON) 30 MG tablet; Take 1 tablet (30 mg total) by mouth at bedtime.  Primary osteoarthritis of both knees -     traMADol (ULTRAM) 50 MG tablet; TAKE 1 TO 2 TABLETS(50 TO 100 MG) BY MOUTH EVERY 8 HOURS AS NEEDED FOR MODERATE PAIN. MAXIMUM 6 TABLETS DAILY  Chronic pain of both knees -     traMADol (ULTRAM) 50 MG tablet; TAKE 1 TO 2 TABLETS(50 TO  100 MG) BY MOUTH EVERY 8 HOURS AS NEEDED FOR MODERATE PAIN. MAXIMUM 6 TABLETS DAILY  Peripheral polyneuropathy -     gabapentin (NEURONTIN) 600 MG tablet; Take one tablet three times a day for neuropathy.   ..PDMP reviewed during this encounter. UDS UTD.  Pain contract UTD.  Pt needs follow up every 3 months to comply with [place] controlled substance prescribing.  Tramadol refilled for back pain  Gabapentin increased for neuropathy to 600mg  TID.   Left flank pain:  U/A- negative Likely musculoskeletal. Discussed stretching, ice, heat.   Patient should try Remeron again for insomnia increased to 30mg .    A1C  to goal at 6.0 Continue glipizide.  Eat small meal when sugar feels low. Not dropping below 89 most days.  On ACE. BP to goal.  On STATIN.  Need eye exam. Made referral.  Foot exam UTD.  Flu and pneumonia UTD.   Marland KitchenHarlon Flor PA-C, have reviewed and agree with the above documentation in it's entirety.

## 2020-09-11 MED ORDER — TRAMADOL HCL 50 MG PO TABS: ORAL_TABLET | ORAL | 0 refills | Status: DC

## 2020-10-09 ENCOUNTER — Telehealth: Payer: Self-pay | Admitting: Physician Assistant

## 2020-10-09 NOTE — Progress Notes (Signed)
  Chronic Care Management   Outreach Note  10/09/2020 Name: Benjamin Ramirez MRN: [id number] DOB: 1935/02/01  Referred by: Jomarie Longs, PA-C Reason for referral : No chief complaint on file.   A second unsuccessful telephone outreach was attempted today. The patient was referred to pharmacist for assistance with care management and care coordination.  Follow Up Plan:   Carmell [place] Upstream Scheduler

## 2020-10-15 ENCOUNTER — Other Ambulatory Visit: Payer: Self-pay | Admitting: Neurology

## 2020-10-15 DIAGNOSIS — F411 Generalized anxiety disorder: Secondary | ICD-10-CM

## 2020-10-15 MED ORDER — HYDROXYZINE HCL 50 MG PO TABS: 50.0000 mg | ORAL_TABLET | Freq: Three times a day (TID) | ORAL | 2 refills | Status: DC | PRN

## 2020-10-17 ENCOUNTER — Other Ambulatory Visit: Payer: Self-pay | Admitting: Physician Assistant

## 2020-10-17 DIAGNOSIS — E11649 Type 2 diabetes mellitus with hypoglycemia without coma: Secondary | ICD-10-CM

## 2020-10-17 DIAGNOSIS — M48062 Spinal stenosis, lumbar region with neurogenic claudication: Secondary | ICD-10-CM

## 2020-10-17 DIAGNOSIS — G629 Polyneuropathy, unspecified: Secondary | ICD-10-CM

## 2020-10-17 DIAGNOSIS — M539 Dorsopathy, unspecified: Secondary | ICD-10-CM

## 2020-10-17 DIAGNOSIS — Z0289 Encounter for other administrative examinations: Secondary | ICD-10-CM

## 2020-10-17 DIAGNOSIS — M4696 Unspecified inflammatory spondylopathy, lumbar region: Secondary | ICD-10-CM

## 2020-10-24 DIAGNOSIS — E119 Type 2 diabetes mellitus without complications: Secondary | ICD-10-CM | POA: Diagnosis not present

## 2020-10-27 ENCOUNTER — Other Ambulatory Visit: Payer: Self-pay | Admitting: Physician Assistant

## 2020-10-27 DIAGNOSIS — G8929 Other chronic pain: Secondary | ICD-10-CM

## 2020-10-27 DIAGNOSIS — M170 Bilateral primary osteoarthritis of knee: Secondary | ICD-10-CM

## 2020-10-27 DIAGNOSIS — M25562 Pain in left knee: Secondary | ICD-10-CM

## 2020-10-30 ENCOUNTER — Telehealth: Payer: Self-pay | Admitting: Physician Assistant

## 2020-10-30 NOTE — Chronic Care Management (AMB) (Signed)
  Chronic Care Management   Outreach Note  10/30/2020 Name: Benjamin Ramirez MRN: [id number] DOB: 1934/08/29  Referred by: Jomarie Longs, PA-C Reason for referral : No chief complaint on file.   Third unsuccessful telephone outreach was attempted today. The patient was referred to the pharmacist for assistance with care management and care coordination.   Follow Up Plan:   Carmell [place] Upstream Scheduler

## 2020-12-11 ENCOUNTER — Ambulatory Visit (INDEPENDENT_AMBULATORY_CARE_PROVIDER_SITE_OTHER): Payer: Medicare Other | Admitting: Physician Assistant

## 2020-12-11 DIAGNOSIS — Z5329 Procedure and treatment not carried out because of patient's decision for other reasons: Secondary | ICD-10-CM

## 2020-12-11 NOTE — Progress Notes (Signed)
No show

## 2021-01-09 ENCOUNTER — Other Ambulatory Visit: Payer: Self-pay | Admitting: Neurology

## 2021-01-09 DIAGNOSIS — E11649 Type 2 diabetes mellitus with hypoglycemia without coma: Secondary | ICD-10-CM

## 2021-01-09 DIAGNOSIS — I498 Other specified cardiac arrhythmias: Secondary | ICD-10-CM

## 2021-01-09 MED ORDER — GLIPIZIDE ER 5 MG PO TB24: 5.0000 mg | ORAL_TABLET | Freq: Every day | ORAL | 0 refills | Status: DC

## 2021-01-09 MED ORDER — ATENOLOL 25 MG PO TABS: 25.0000 mg | ORAL_TABLET | Freq: Every day | ORAL | 0 refills | Status: DC

## 2021-01-22 ENCOUNTER — Ambulatory Visit (INDEPENDENT_AMBULATORY_CARE_PROVIDER_SITE_OTHER): Payer: Medicare Other | Admitting: Physician Assistant

## 2021-01-22 ENCOUNTER — Encounter: Payer: Self-pay | Admitting: Physician Assistant

## 2021-01-22 VITALS — BP 118/66 | HR 61 | Ht 64.0 in | Wt 169.0 lb

## 2021-01-22 DIAGNOSIS — M539 Dorsopathy, unspecified: Secondary | ICD-10-CM

## 2021-01-22 DIAGNOSIS — I498 Other specified cardiac arrhythmias: Secondary | ICD-10-CM

## 2021-01-22 DIAGNOSIS — M25561 Pain in right knee: Secondary | ICD-10-CM | POA: Diagnosis not present

## 2021-01-22 DIAGNOSIS — F411 Generalized anxiety disorder: Secondary | ICD-10-CM

## 2021-01-22 DIAGNOSIS — M25562 Pain in left knee: Secondary | ICD-10-CM

## 2021-01-22 DIAGNOSIS — Z0289 Encounter for other administrative examinations: Secondary | ICD-10-CM

## 2021-01-22 DIAGNOSIS — Z23 Encounter for immunization: Secondary | ICD-10-CM

## 2021-01-22 DIAGNOSIS — M170 Bilateral primary osteoarthritis of knee: Secondary | ICD-10-CM | POA: Diagnosis not present

## 2021-01-22 DIAGNOSIS — E1142 Type 2 diabetes mellitus with diabetic polyneuropathy: Secondary | ICD-10-CM | POA: Diagnosis not present

## 2021-01-22 DIAGNOSIS — G629 Polyneuropathy, unspecified: Secondary | ICD-10-CM

## 2021-01-22 DIAGNOSIS — E1159 Type 2 diabetes mellitus with other circulatory complications: Secondary | ICD-10-CM | POA: Diagnosis not present

## 2021-01-22 DIAGNOSIS — K580 Irritable bowel syndrome with diarrhea: Secondary | ICD-10-CM

## 2021-01-22 DIAGNOSIS — I152 Hypertension secondary to endocrine disorders: Secondary | ICD-10-CM | POA: Diagnosis not present

## 2021-01-22 DIAGNOSIS — M48062 Spinal stenosis, lumbar region with neurogenic claudication: Secondary | ICD-10-CM

## 2021-01-22 DIAGNOSIS — M4696 Unspecified inflammatory spondylopathy, lumbar region: Secondary | ICD-10-CM

## 2021-01-22 DIAGNOSIS — G8929 Other chronic pain: Secondary | ICD-10-CM

## 2021-01-22 DIAGNOSIS — E11649 Type 2 diabetes mellitus with hypoglycemia without coma: Secondary | ICD-10-CM | POA: Diagnosis not present

## 2021-01-22 LAB — POCT GLYCOSYLATED HEMOGLOBIN (HGB A1C): Hemoglobin A1C: 6.2 % — AB (ref 4.0–5.6)

## 2021-01-22 MED ORDER — DICYCLOMINE HCL 10 MG PO CAPS: 10.0000 mg | ORAL_CAPSULE | Freq: Two times a day (BID) | ORAL | 2 refills | Status: DC

## 2021-01-22 MED ORDER — TRAMADOL HCL 50 MG PO TABS: ORAL_TABLET | ORAL | 0 refills | Status: DC

## 2021-01-22 MED ORDER — GABAPENTIN 600 MG PO TABS: ORAL_TABLET | ORAL | 1 refills | Status: DC

## 2021-01-22 MED ORDER — LISINOPRIL 10 MG PO TABS: 10.0000 mg | ORAL_TABLET | Freq: Every day | ORAL | 1 refills | Status: DC

## 2021-01-22 MED ORDER — ATENOLOL 25 MG PO TABS: 25.0000 mg | ORAL_TABLET | Freq: Every day | ORAL | 1 refills | Status: DC

## 2021-01-22 MED ORDER — HYDROXYZINE HCL 50 MG PO TABS
50.0000 mg | ORAL_TABLET | Freq: Three times a day (TID) | ORAL | 2 refills | Status: DC | PRN
Start: 2021-01-22 — End: 2021-11-20

## 2021-01-22 MED ORDER — DULOXETINE HCL 60 MG PO CPEP: 60.0000 mg | ORAL_CAPSULE | Freq: Two times a day (BID) | ORAL | 3 refills | Status: DC

## 2021-01-22 MED ORDER — GLIPIZIDE ER 5 MG PO TB24: 5.0000 mg | ORAL_TABLET | Freq: Every day | ORAL | 0 refills | Status: DC

## 2021-01-22 NOTE — Progress Notes (Signed)
Subjective:    Patient ID: Benjamin Ramirez, male    DOB: [birth year], 86 y.o.   MRN: [id number]  HPI Pt is a 86 yo male with t2DM, HTN, aortic atherosclerosis, CKD, GERD, and chronic pain who presents to the clinic for medication refill.   He is not checking sugars. He states he is compliant with medications. He has ongoing polyneuropathy. He does not exercise.   He denies any CP, palpitations, headaches, dizziness.   Pain is controlled fairly well on tramadol.   He does admit to intermittent diarrhea and nausea ongoing but more for the last month. Not tried anything to make better.  .. Active Ambulatory Problems    Diagnosis Date Noted   Chronic sinusitis 10/06/2011   Erectile dysfunction 10/06/2011   GERD (gastroesophageal reflux disease) 10/06/2011   Hiatal hernia 10/06/2011   S/P ERCP 09/22/2011   Seasonal allergies 10/06/2011   Spinal stenosis 05/29/2017   Spondylitis ([employer]) 10/06/2011   History of myocardial infarction    Hypertension associated with diabetes ([place]) 05/29/2017   Insomnia secondary to chronic pain 05/29/2017   Encounter for chronic pain management 05/29/2017   Neurogenic claudication due to lumbar spinal stenosis 05/29/2017   Therapeutic opioid induced constipation 05/29/2017   Diet-controlled diabetes mellitus ([place]) 75/17/0017   Umbilical hernia without obstruction or gangrene    Fatty liver    Hearing difficulty of both ears 06/05/2017   Peripheral polyneuropathy 06/05/2017   Aortic atherosclerosis ([place]) 06/05/2017   Venous stasis dermatitis of both lower extremities 06/05/2017   Muscle atrophy of lower extremity 06/05/2017   Onychomycosis due to dermatophyte 06/05/2017   History of echocardiogram 06/05/2017   Cardiomegaly 06/05/2017   Pain management contract signed 06/05/2017   Mild mitral regurgitation 06/11/2017   Elevated serum creatinine 02/16/2018   CKD (chronic kidney disease) stage 3, GFR 30-59 ml/min (HCC) 06/22/2019   Hyperlipidemia LDL goal  <70 06/22/2019   Chronic pain of both knees 06/22/2019   Anxiety 06/22/2019   Multilevel degenerative disc disease 06/22/2019   Controlled type 2 diabetes mellitus with diabetic polyneuropathy, without long-term current use of insulin ([place]) 06/22/2019   Primary osteoarthritis of both knees 06/27/2019   Irregular heart rhythm 01/03/2020   Sinus arrhythmia seen on electrocardiogram 01/05/2020   Uncontrolled type 2 diabetes mellitus with hyperglycemia ([place]) 04/02/2020   GAD (generalized anxiety disorder) 06/12/2020   Resolved Ambulatory Problems    Diagnosis Date Noted   Diabetes mellitus with neurological manifestations, controlled ([place]) 07/26/2014   Myocardial infarction ([place]) 10/06/2011   Osteoarthritis 10/06/2011   Acute cystitis with hematuria 02/16/2018   Past Medical History:  Diagnosis Date   Allergy    Atherosclerosis of aorta (HCC)    CAD (coronary artery disease)    Chronic back pain    Depression    Diabetes mellitus without complication ([place])    Hypertension      Review of Systems    See HPI.  Objective:   Physical Exam Vitals reviewed.  Constitutional:      Appearance: Normal appearance.  HENT:     Head: Normocephalic.  Cardiovascular:     Rate and Rhythm: Normal rate and regular rhythm.     Pulses: Normal pulses.     Heart sounds: Normal heart sounds.  Pulmonary:     Effort: Pulmonary effort is normal.     Breath sounds: Normal breath sounds.  Abdominal:     General: Bowel sounds are normal. There is no distension.     Palpations: Abdomen is soft.  There is no mass.     Tenderness: There is no abdominal tenderness. There is no right CVA tenderness, left CVA tenderness, guarding or rebound.  Musculoskeletal:     Right lower leg: No edema.     Left lower leg: No edema.  Neurological:     General: No focal deficit present.     Mental Status: He is alert and oriented to person, place, and time.  Psychiatric:        Mood and Affect: Mood normal.         Behavior: Behavior normal.      .. Results for orders placed or performed in visit on 01/22/21  COMPLETE METABOLIC PANEL WITH GFR  Result Value Ref Range   Glucose, Bld 130 (H) 65 - 99 mg/dL   BUN 21 7 - 25 mg/dL   Creat 1.35 (H) 0.70 - 1.22 mg/dL   eGFR 52 (L) > OR = 60 mL/min/1.73m2   BUN/Creatinine Ratio 16 6 - 22 (calc)   Sodium 140 135 - 146 mmol/L   Potassium 5.3 3.5 - 5.3 mmol/L   Chloride 105 98 - 110 mmol/L   CO2 26 20 - 32 mmol/L   Calcium 9.8 8.6 - 10.3 mg/dL   Total Protein 7.0 6.1 - 8.1 g/dL   Albumin 4.2 3.6 - 5.1 g/dL   Globulin 2.8 1.9 - 3.7 g/dL (calc)   AG Ratio 1.5 1.0 - 2.5 (calc)   Total Bilirubin 0.6 0.2 - 1.2 mg/dL   Alkaline phosphatase (APISO) 61 35 - 144 U/L   AST 14 10 - 35 U/L   ALT 14 9 - 46 U/L  SPECIMEN COMPROMISED  Result Value Ref Range   Specimen Integrity    POCT glycosylated hemoglobin (Hb A1C)  Result Value Ref Range   Hemoglobin A1C 6.2 (A) 4.0 - 5.6 %   HbA1c POC (<> result, manual entry)     HbA1c, POC (prediabetic range)     HbA1c, POC (controlled diabetic range)         Assessment & Plan:  ..Jerad was seen today for diabetes.  Diagnoses and all orders for this visit:  Controlled type 2 diabetes mellitus with diabetic polyneuropathy, without long-term current use of insulin (HCC) -     POCT glycosylated hemoglobin (Hb A1C) -     glipiZIDE (GLUCOTROL XL) 5 MG 24 hr tablet; Take 1 tablet (5 mg total) by mouth daily with breakfast. -     COMPLETE METABOLIC PANEL WITH GFR  Flu vaccine need -     Flu Vaccine QUAD High Dose(Fluad)  Encounter for chronic pain management -     traMADol (ULTRAM) 50 MG tablet; TAKE 1 TO 2 TABLETS(50 TO 100 MG) BY MOUTH EVERY 8 HOURS AS NEEDED FOR MODERATE PAIN. MAXIMUM 6 TABLETS DAILY  Primary osteoarthritis of both knees -     traMADol (ULTRAM) 50 MG tablet; TAKE 1 TO 2 TABLETS(50 TO 100 MG) BY MOUTH EVERY 8 HOURS AS NEEDED FOR MODERATE PAIN. MAXIMUM 6 TABLETS DAILY  Chronic pain of both  knees -     traMADol (ULTRAM) 50 MG tablet; TAKE 1 TO 2 TABLETS(50 TO 100 MG) BY MOUTH EVERY 8 HOURS AS NEEDED FOR MODERATE PAIN. MAXIMUM 6 TABLETS DAILY -     DULoxetine (CYMBALTA) 60 MG capsule; Take 1 capsule (60 mg total) by mouth 2 (two) times daily.  Hypertension associated with diabetes (HCC) -     lisinopril (ZESTRIL) 10 MG tablet; Take 1 tablet (10 mg total)   by mouth daily. -     COMPLETE METABOLIC PANEL WITH GFR  GAD (generalized anxiety disorder) -     hydrOXYzine (ATARAX/VISTARIL) 50 MG tablet; Take 1 tablet (50 mg total) by mouth 3 (three) times daily as needed. -     COMPLETE METABOLIC PANEL WITH GFR  Peripheral polyneuropathy -     gabapentin (NEURONTIN) 600 MG tablet; Take one tablet three times a day for neuropathy. -     DULoxetine (CYMBALTA) 60 MG capsule; Take 1 capsule (60 mg total) by mouth 2 (two) times daily.  Multilevel degenerative disc disease -     DULoxetine (CYMBALTA) 60 MG capsule; Take 1 capsule (60 mg total) by mouth 2 (two) times daily.  Neurogenic claudication due to lumbar spinal stenosis -     DULoxetine (CYMBALTA) 60 MG capsule; Take 1 capsule (60 mg total) by mouth 2 (two) times daily.  Inflammatory spondylopathy of lumbar region (HCC) -     DULoxetine (CYMBALTA) 60 MG capsule; Take 1 capsule (60 mg total) by mouth 2 (two) times daily.  Pain management contract signed -     DULoxetine (CYMBALTA) 60 MG capsule; Take 1 capsule (60 mg total) by mouth 2 (two) times daily.  Sinus arrhythmia seen on electrocardiogram -     atenolol (TENORMIN) 25 MG tablet; Take 1 tablet (25 mg total) by mouth daily.  Irritable bowel syndrome with diarrhea -     dicyclomine (BENTYL) 10 MG capsule; Take 1 capsule (10 mg total) by mouth 2 (two) times daily before a meal.  Other orders -     SPECIMEN COMPROMISED  Pain contract UTD UDS UTD.  ..PDMP reviewed during this encounter. No concerns.  Refilled tramadol.  Continue cymbalta and gabapentin.   A1C to goal.   Continue on glipizide.  BP to goal.  On Statin.  Declined shingrix.  Declined covid vaccination.  Flu shot given today.    IbS- try bentyl, probiotic and food avoidance.     Strongly advised to stop smoking. Pt declined.   

## 2021-01-23 LAB — COMPLETE METABOLIC PANEL WITH GFR
AG Ratio: 1.5 (calc) (ref 1.0–2.5)
ALT: 14 U/L (ref 9–46)
AST: 14 U/L (ref 10–35)
Albumin: 4.2 g/dL (ref 3.6–5.1)
Alkaline phosphatase (APISO): 61 U/L (ref 35–144)
BUN/Creatinine Ratio: 16 (calc) (ref 6–22)
BUN: 21 mg/dL (ref 7–25)
CO2: 26 mmol/L (ref 20–32)
Calcium: 9.8 mg/dL (ref 8.6–10.3)
Chloride: 105 mmol/L (ref 98–110)
Creat: 1.35 mg/dL — ABNORMAL HIGH (ref 0.70–1.22)
Globulin: 2.8 g/dL (calc) (ref 1.9–3.7)
Glucose, Bld: 130 mg/dL — ABNORMAL HIGH (ref 65–99)
Potassium: 5.3 mmol/L (ref 3.5–5.3)
Sodium: 140 mmol/L (ref 135–146)
Total Bilirubin: 0.6 mg/dL (ref 0.2–1.2)
Total Protein: 7 g/dL (ref 6.1–8.1)
eGFR: 52 mL/min/{1.73_m2} — ABNORMAL LOW (ref >=60)

## 2021-01-23 LAB — SPECIMEN COMPROMISED

## 2021-01-23 NOTE — Progress Notes (Signed)
CMP not ran due to compromise. Will get blood at next follow up.

## 2021-01-23 NOTE — Progress Notes (Signed)
Ok it did result. GFR decreased a bit. I would like to start a medication called farxiga that can help prevent further declined of kidney function. Are you ok with this?

## 2021-01-28 ENCOUNTER — Encounter: Payer: Self-pay | Admitting: Neurology

## 2021-01-29 ENCOUNTER — Other Ambulatory Visit: Payer: Self-pay | Admitting: Neurology

## 2021-01-29 DIAGNOSIS — E1142 Type 2 diabetes mellitus with diabetic polyneuropathy: Secondary | ICD-10-CM

## 2021-01-29 MED ORDER — GLIPIZIDE ER 5 MG PO TB24: 5.0000 mg | ORAL_TABLET | Freq: Every day | ORAL | 0 refills | Status: DC

## 2021-02-22 ENCOUNTER — Other Ambulatory Visit: Payer: Self-pay | Admitting: Physician Assistant

## 2021-02-22 DIAGNOSIS — G8929 Other chronic pain: Secondary | ICD-10-CM

## 2021-02-22 DIAGNOSIS — M170 Bilateral primary osteoarthritis of knee: Secondary | ICD-10-CM

## 2021-02-22 DIAGNOSIS — M25561 Pain in right knee: Secondary | ICD-10-CM

## 2021-03-27 ENCOUNTER — Other Ambulatory Visit: Payer: Self-pay

## 2021-03-27 ENCOUNTER — Ambulatory Visit (INDEPENDENT_AMBULATORY_CARE_PROVIDER_SITE_OTHER): Payer: Medicare Other | Admitting: Physician Assistant

## 2021-03-27 ENCOUNTER — Encounter: Payer: Self-pay | Admitting: Physician Assistant

## 2021-03-27 VITALS — BP 122/50 | HR 32 | Temp 99.4°F | Ht 64.0 in | Wt 168.0 lb

## 2021-03-27 DIAGNOSIS — Z884 Allergy status to anesthetic agent status: Secondary | ICD-10-CM | POA: Diagnosis not present

## 2021-03-27 DIAGNOSIS — R42 Dizziness and giddiness: Secondary | ICD-10-CM

## 2021-03-27 DIAGNOSIS — I152 Hypertension secondary to endocrine disorders: Secondary | ICD-10-CM | POA: Diagnosis not present

## 2021-03-27 DIAGNOSIS — I252 Old myocardial infarction: Secondary | ICD-10-CM | POA: Diagnosis not present

## 2021-03-27 DIAGNOSIS — R600 Localized edema: Secondary | ICD-10-CM | POA: Diagnosis not present

## 2021-03-27 DIAGNOSIS — Z883 Allergy status to other anti-infective agents status: Secondary | ICD-10-CM | POA: Diagnosis not present

## 2021-03-27 DIAGNOSIS — Z7982 Long term (current) use of aspirin: Secondary | ICD-10-CM | POA: Diagnosis not present

## 2021-03-27 DIAGNOSIS — F411 Generalized anxiety disorder: Secondary | ICD-10-CM

## 2021-03-27 DIAGNOSIS — E1159 Type 2 diabetes mellitus with other circulatory complications: Secondary | ICD-10-CM | POA: Diagnosis not present

## 2021-03-27 DIAGNOSIS — I499 Cardiac arrhythmia, unspecified: Secondary | ICD-10-CM | POA: Diagnosis not present

## 2021-03-27 DIAGNOSIS — E119 Type 2 diabetes mellitus without complications: Secondary | ICD-10-CM | POA: Diagnosis not present

## 2021-03-27 DIAGNOSIS — Z7984 Long term (current) use of oral hypoglycemic drugs: Secondary | ICD-10-CM | POA: Diagnosis not present

## 2021-03-27 DIAGNOSIS — I10 Essential (primary) hypertension: Secondary | ICD-10-CM | POA: Diagnosis not present

## 2021-03-27 DIAGNOSIS — K219 Gastro-esophageal reflux disease without esophagitis: Secondary | ICD-10-CM | POA: Diagnosis not present

## 2021-03-27 DIAGNOSIS — F32A Depression, unspecified: Secondary | ICD-10-CM | POA: Diagnosis not present

## 2021-03-27 DIAGNOSIS — Z881 Allergy status to other antibiotic agents status: Secondary | ICD-10-CM | POA: Diagnosis not present

## 2021-03-27 DIAGNOSIS — R001 Bradycardia, unspecified: Secondary | ICD-10-CM

## 2021-03-27 DIAGNOSIS — Z20822 Contact with and (suspected) exposure to covid-19: Secondary | ICD-10-CM | POA: Diagnosis not present

## 2021-03-27 DIAGNOSIS — I441 Atrioventricular block, second degree: Secondary | ICD-10-CM | POA: Diagnosis not present

## 2021-03-27 DIAGNOSIS — Z79899 Other long term (current) drug therapy: Secondary | ICD-10-CM | POA: Diagnosis not present

## 2021-03-27 DIAGNOSIS — F419 Anxiety disorder, unspecified: Secondary | ICD-10-CM

## 2021-03-27 MED ORDER — ALPRAZOLAM 0.25 MG PO TABS
0.2500 mg | ORAL_TABLET | Freq: Two times a day (BID) | ORAL | 0 refills | Status: DC | PRN
Start: 2021-03-27 — End: 2021-04-10

## 2021-03-27 NOTE — Progress Notes (Signed)
Subjective:    Patient ID: Benjamin Ramirez, male    DOB: [birth year], 86 y.o.   MRN: [id number]  HPI Pt is a 86 yo male with HTN, cardiomegaly, mitral regurgitation, T2DM, HLD, Anxiety, Depression who presents to the clinic with his caregiver to follow up on variable blood pressure and anxiety.   Caregiver is adamant that variable blood pressure is related to anxiety. He is taking hydroxyzine TID, cymbalta, and remeron. Pt continues to c/o anxiety and she gives him her xanax and that calms him down. Per pt remeron makes him sleep too much.   Pt reports he is dizzy and feels weak more the last few days. No CP. Hx of MI many years ago.   .. Active Ambulatory Problems    Diagnosis Date Noted   Chronic sinusitis 10/06/2011   Erectile dysfunction 10/06/2011   GERD (gastroesophageal reflux disease) 10/06/2011   Hiatal hernia 10/06/2011   S/P ERCP 09/22/2011   Seasonal allergies 10/06/2011   Spinal stenosis 05/29/2017   Spondylitis ([place]) 10/06/2011   History of myocardial infarction    Hypertension associated with diabetes ([place]) 05/29/2017   Insomnia secondary to chronic pain 05/29/2017   Encounter for chronic pain management 05/29/2017   Neurogenic claudication due to lumbar spinal stenosis 05/29/2017   Therapeutic opioid induced constipation 05/29/2017   Diet-controlled diabetes mellitus ([place]) 0000000   Umbilical hernia without obstruction or gangrene    Fatty liver    Hearing difficulty of both ears 06/05/2017   Peripheral polyneuropathy 06/05/2017   Aortic atherosclerosis ([place]) 06/05/2017   Venous stasis dermatitis of both lower extremities 06/05/2017   Muscle atrophy of lower extremity 06/05/2017   Onychomycosis due to dermatophyte 06/05/2017   History of echocardiogram 06/05/2017   Cardiomegaly 06/05/2017   Pain management contract signed 06/05/2017   Mild mitral regurgitation 06/11/2017   Elevated serum creatinine 02/16/2018   CKD (chronic kidney disease) stage 3, GFR 30-59  ml/min (HCC) 06/22/2019   Hyperlipidemia LDL goal <70 06/22/2019   Chronic pain of both knees 06/22/2019   Anxiety 06/22/2019   Multilevel degenerative disc disease 06/22/2019   Controlled type 2 diabetes mellitus with diabetic polyneuropathy, without long-term current use of insulin ([place]) 06/22/2019   Primary osteoarthritis of both knees 06/27/2019   Irregular heart rhythm 01/03/2020   Sinus arrhythmia seen on electrocardiogram 01/05/2020   Uncontrolled type 2 diabetes mellitus with hyperglycemia ([place]) 04/02/2020   GAD (generalized anxiety disorder) 06/12/2020   Regularly irregular pulse rhythym 03/27/2021   Resolved Ambulatory Problems    Diagnosis Date Noted   Diabetes mellitus with neurological manifestations, controlled ([place]) 07/26/2014   Myocardial infarction ([place]) 10/06/2011   Osteoarthritis 10/06/2011   Acute cystitis with hematuria 02/16/2018   Past Medical History:  Diagnosis Date   Allergy    Atherosclerosis of aorta (HCC)    CAD (coronary artery disease)    Chronic back pain    Depression    Diabetes mellitus without complication ([place])    Hypertension      Review of Systems See HPI.     Objective:   Physical Exam Vitals reviewed.  Constitutional:      Appearance: Normal appearance.  HENT:     Head: Normocephalic.  Cardiovascular:     Rate and Rhythm: Bradycardia present. Rhythm irregular.     Heart sounds: Murmur heard.  Pulmonary:     Effort: Pulmonary effort is normal.     Breath sounds: Normal breath sounds.  Musculoskeletal:     Comments: 1 + pitting edema  bilateral ankles with hemosiderin.   Neurological:     General: No focal deficit present.     Mental Status: He is alert and oriented to person, place, and time.  Psychiatric:     Comments: anxious    .Marland Kitchen Depression screen [facility] 2/9 03/27/2021 03/26/2020 05/29/2017  Decreased Interest 0 0 1  Down, Depressed, Hopeless 0 0 0  PHQ - 2 Score 0 0 1  Altered sleeping - 3 3  Tired, decreased  energy - 2 2  Change in appetite - 0 1  Feeling bad or failure about yourself  - 0 1  Trouble concentrating - 0 1  Moving slowly or fidgety/restless - 0 1  Suicidal thoughts - 0 0  PHQ-9 Score - 5 10  Difficult doing work/chores - Not difficult at all -   ..  .. GAD 7 : Generalized Anxiety Score 03/26/2020 05/29/2017  Nervous, Anxious, on Edge 1 3  Control/stop worrying 1 3  Worry too much - different things 1 3  Trouble relaxing 1 3  Restless 0 3  Easily annoyed or irritable 2 3  Afraid - awful might happen 0 3  Total GAD 7 Score 6 21  Anxiety Difficulty Somewhat difficult -          Assessment & Plan:  Marland KitchenMarland KitchenKeison was seen today for hypertension.  Diagnoses and all orders for this visit:  Regularly irregular pulse rhythym  Hypertension associated with diabetes (HCC) -     EKG 12-Lead  Sinus bradycardia  GAD (generalized anxiety disorder)  Anxiety  Dizziness  Other orders -     ALPRAZolam (XANAX) 0.25 MG tablet; Take 1 tablet (0.25 mg total) by mouth 2 (two) times daily as needed for anxiety.   HR was 32 at first detection.  EKG was done.  Irregular regular rate with long pauses btw beats and very low pwaves and flipped QRS waves in V1-V3.  Changes from EKG 01/2020. Discussed with suprivising physician Dr. Linford Arnold and since patient does not have cardiologist sent to ED. Concern for MI causing abnormal rhythm.   Cut back on remeron to 1/2 tablet for sleep.  Given #20 of xanax .25mg  to use as needed for anxiety.  Follow up in 2 weeks.

## 2021-03-27 NOTE — Patient Instructions (Addendum)
Cut remeron in half.  Xanax just for acute anxiety.  Go to ED. No cardiologist.

## 2021-03-28 DIAGNOSIS — R001 Bradycardia, unspecified: Secondary | ICD-10-CM | POA: Diagnosis not present

## 2021-03-28 DIAGNOSIS — K219 Gastro-esophageal reflux disease without esophagitis: Secondary | ICD-10-CM | POA: Diagnosis not present

## 2021-03-28 DIAGNOSIS — E119 Type 2 diabetes mellitus without complications: Secondary | ICD-10-CM | POA: Diagnosis not present

## 2021-03-28 DIAGNOSIS — I10 Essential (primary) hypertension: Secondary | ICD-10-CM | POA: Diagnosis not present

## 2021-03-28 DIAGNOSIS — I441 Atrioventricular block, second degree: Secondary | ICD-10-CM | POA: Diagnosis not present

## 2021-04-04 ENCOUNTER — Other Ambulatory Visit: Payer: Self-pay

## 2021-04-04 DIAGNOSIS — M170 Bilateral primary osteoarthritis of knee: Secondary | ICD-10-CM

## 2021-04-04 DIAGNOSIS — G8929 Other chronic pain: Secondary | ICD-10-CM

## 2021-04-05 ENCOUNTER — Other Ambulatory Visit: Payer: Self-pay | Admitting: Physician Assistant

## 2021-04-05 DIAGNOSIS — M170 Bilateral primary osteoarthritis of knee: Secondary | ICD-10-CM

## 2021-04-05 DIAGNOSIS — M25562 Pain in left knee: Secondary | ICD-10-CM

## 2021-04-05 DIAGNOSIS — G8929 Other chronic pain: Secondary | ICD-10-CM

## 2021-04-05 MED ORDER — TRAMADOL HCL 50 MG PO TABS: ORAL_TABLET | ORAL | 0 refills | Status: DC

## 2021-04-10 ENCOUNTER — Ambulatory Visit (INDEPENDENT_AMBULATORY_CARE_PROVIDER_SITE_OTHER): Payer: Medicare Other | Admitting: Physician Assistant

## 2021-04-10 DIAGNOSIS — E1142 Type 2 diabetes mellitus with diabetic polyneuropathy: Secondary | ICD-10-CM | POA: Diagnosis not present

## 2021-04-10 DIAGNOSIS — M25561 Pain in right knee: Secondary | ICD-10-CM

## 2021-04-10 DIAGNOSIS — I499 Cardiac arrhythmia, unspecified: Secondary | ICD-10-CM

## 2021-04-10 DIAGNOSIS — M170 Bilateral primary osteoarthritis of knee: Secondary | ICD-10-CM

## 2021-04-10 DIAGNOSIS — M25562 Pain in left knee: Secondary | ICD-10-CM | POA: Diagnosis not present

## 2021-04-10 DIAGNOSIS — R001 Bradycardia, unspecified: Secondary | ICD-10-CM

## 2021-04-10 DIAGNOSIS — I152 Hypertension secondary to endocrine disorders: Secondary | ICD-10-CM | POA: Diagnosis not present

## 2021-04-10 DIAGNOSIS — E1159 Type 2 diabetes mellitus with other circulatory complications: Secondary | ICD-10-CM

## 2021-04-10 DIAGNOSIS — E11649 Type 2 diabetes mellitus with hypoglycemia without coma: Secondary | ICD-10-CM | POA: Diagnosis not present

## 2021-04-10 DIAGNOSIS — F419 Anxiety disorder, unspecified: Secondary | ICD-10-CM

## 2021-04-10 DIAGNOSIS — G8929 Other chronic pain: Secondary | ICD-10-CM | POA: Diagnosis not present

## 2021-04-10 MED ORDER — CLONAZEPAM 0.5 MG PO TABS: 0.5000 mg | ORAL_TABLET | Freq: Two times a day (BID) | ORAL | 2 refills | Status: DC | PRN

## 2021-04-10 MED ORDER — TRUE METRIX BLOOD GLUCOSE TEST VI STRP: ORAL_STRIP | 12 refills | Status: DC

## 2021-04-10 NOTE — Progress Notes (Signed)
Subjective:    Patient ID: Benjamin Ramirez, male    DOB: [birth year], 86 y.o.   MRN: [id number]  HPI Pt is a 86 yo male with Aortic Atherosclerosis, HTN, Mitral Regurgitation, irregular heart rhythm, chronic low back pain and anxiety who presents to the clinic for follow up.   He was sent to the ED at last visit due to bradycardia. Left hospital with below plan.    PLAN: -Cardiology consult -Continue aspirin -Hold atenolol  -Diabetic diet.  -NPO past midnight.   His HR is up some and does feel better. He continues to have anxiety. His caregiver has been giving him xanax and she states "it helps him so much". She request we give him some to help him. The other anxiety medication does not work as good.   .. Active Ambulatory Problems    Diagnosis Date Noted   Chronic sinusitis 10/06/2011   Erectile dysfunction 10/06/2011   GERD (gastroesophageal reflux disease) 10/06/2011   Hiatal hernia 10/06/2011   S/P ERCP 09/22/2011   Seasonal allergies 10/06/2011   Spinal stenosis 05/29/2017   Spondylitis ([place]) 10/06/2011   History of myocardial infarction    Hypertension associated with diabetes ([place]) 05/29/2017   Insomnia secondary to chronic pain 05/29/2017   Encounter for chronic pain management 05/29/2017   Neurogenic claudication due to lumbar spinal stenosis 05/29/2017   Therapeutic opioid induced constipation 05/29/2017   Diet-controlled diabetes mellitus ([place]) 0000000   Umbilical hernia without obstruction or gangrene    Fatty liver    Hearing difficulty of both ears 06/05/2017   Peripheral polyneuropathy 06/05/2017   Aortic atherosclerosis ([place]) 06/05/2017   Venous stasis dermatitis of both lower extremities 06/05/2017   Muscle atrophy of lower extremity 06/05/2017   Onychomycosis due to dermatophyte 06/05/2017   History of echocardiogram 06/05/2017   Cardiomegaly 06/05/2017   Pain management contract signed 06/05/2017   Mild mitral regurgitation 06/11/2017   Elevated  serum creatinine 02/16/2018   CKD (chronic kidney disease) stage 3, GFR 30-59 ml/min (HCC) 06/22/2019   Hyperlipidemia LDL goal <70 06/22/2019   Chronic pain of both knees 06/22/2019   Anxiety 06/22/2019   Multilevel degenerative disc disease 06/22/2019   Controlled type 2 diabetes mellitus with diabetic polyneuropathy, without long-term current use of insulin ([place]) 06/22/2019   Primary osteoarthritis of both knees 06/27/2019   Irregular heart rhythm 01/03/2020   Sinus arrhythmia seen on electrocardiogram 01/05/2020   Uncontrolled type 2 diabetes mellitus with hyperglycemia ([place]) 04/02/2020   GAD (generalized anxiety disorder) 06/12/2020   Regularly irregular pulse rhythym 03/27/2021   Bilateral lower extremity edema 03/27/2021   Dizziness 03/27/2021   Resolved Ambulatory Problems    Diagnosis Date Noted   Diabetes mellitus with neurological manifestations, controlled ([place]) 07/26/2014   Myocardial infarction ([place]) 10/06/2011   Osteoarthritis 10/06/2011   Acute cystitis with hematuria 02/16/2018   Past Medical History:  Diagnosis Date   Allergy    Atherosclerosis of aorta (HCC)    CAD (coronary artery disease)    Chronic back pain    Depression    Diabetes mellitus without complication ([place])    Hypertension       Review of Systems See HPI.     Objective:   Physical Exam Vitals reviewed.  Constitutional:      Appearance: Normal appearance.  HENT:     Head: Normocephalic.  Cardiovascular:     Rate and Rhythm: Rhythm irregular.     Pulses: Normal pulses.     Heart sounds: Murmur heard.  Musculoskeletal:     Right lower leg: No edema.     Left lower leg: No edema.  Neurological:     General: No focal deficit present.     Mental Status: He is alert and oriented to person, place, and time.  Psychiatric:        Mood and Affect: Mood normal.      .. Depression screen [facility] 2/9 03/27/2021 03/26/2020 05/29/2017  Decreased Interest 0 0 1  Down, Depressed, Hopeless 0  0 0  PHQ - 2 Score 0 0 1  Altered sleeping - 3 3  Tired, decreased energy - 2 2  Change in appetite - 0 1  Feeling bad or failure about yourself  - 0 1  Trouble concentrating - 0 1  Moving slowly or fidgety/restless - 0 1  Suicidal thoughts - 0 0  PHQ-9 Score - 5 10  Difficult doing work/chores - Not difficult at all -   .. GAD 7 : Generalized Anxiety Score 03/26/2020 05/29/2017  Nervous, Anxious, on Edge 1 3  Control/stop worrying 1 3  Worry too much - different things 1 3  Trouble relaxing 1 3  Restless 0 3  Easily annoyed or irritable 2 3  Afraid - awful might happen 0 3  Total GAD 7 Score 6 21  Anxiety Difficulty Somewhat difficult -         Assessment & Plan:  Marland KitchenMarland KitchenTamon was seen today for follow-up.  Diagnoses and all orders for this visit:  Controlled type 2 diabetes mellitus with diabetic polyneuropathy, without long-term current use of insulin (HCC) -     glucose blood (TRUE METRIX BLOOD GLUCOSE TEST) test strip; Dx DM E11.9 - Check fasting blood sugar every morning and 2 hours after largest meal 2 times weekly.  Controlled type 2 diabetes mellitus with hypoglycemia, without long-term current use of insulin (HCC)  Bradycardia -     Ambulatory referral to Cardiology  Hypertension associated with diabetes [facility]) -     Ambulatory referral to Cardiology  Regularly irregular pulse rhythym -     Ambulatory referral to Cardiology  Anxiety -     clonazePAM (KLONOPIN) 0.5 MG tablet; Take 1 tablet (0.5 mg total) by mouth 2 (two) times daily as needed for anxiety.  Made referral for cardiology.   Marland KitchenMarland KitchenPDMP reviewed during this encounter. Tramadol refilled.  Do not take klonapin with tramadol.  Continue with cymbalta and hydroxizine.

## 2021-04-12 ENCOUNTER — Encounter: Payer: Self-pay | Admitting: Physician Assistant

## 2021-04-12 ENCOUNTER — Other Ambulatory Visit: Payer: Self-pay | Admitting: Neurology

## 2021-04-12 DIAGNOSIS — M25561 Pain in right knee: Secondary | ICD-10-CM

## 2021-04-12 DIAGNOSIS — G8929 Other chronic pain: Secondary | ICD-10-CM

## 2021-04-12 DIAGNOSIS — M170 Bilateral primary osteoarthritis of knee: Secondary | ICD-10-CM

## 2021-04-12 MED ORDER — TRAMADOL HCL 50 MG PO TABS: ORAL_TABLET | ORAL | 0 refills | Status: DC

## 2021-04-12 NOTE — Telephone Encounter (Signed)
Patient's caretaker called and states [facility] does not have Tramadol RX sent 04/05/2021. It does look like it was "printed" instead of sent electronically. Confirmed that RX not at [facility], can you resend?

## 2021-04-23 ENCOUNTER — Other Ambulatory Visit: Payer: Self-pay | Admitting: Physician Assistant

## 2021-04-23 ENCOUNTER — Ambulatory Visit (INDEPENDENT_AMBULATORY_CARE_PROVIDER_SITE_OTHER): Payer: Self-pay | Admitting: Physician Assistant

## 2021-04-23 DIAGNOSIS — E1142 Type 2 diabetes mellitus with diabetic polyneuropathy: Secondary | ICD-10-CM

## 2021-04-23 DIAGNOSIS — Z91199 Patient's noncompliance with other medical treatment and regimen due to unspecified reason: Secondary | ICD-10-CM

## 2021-04-23 NOTE — Progress Notes (Signed)
No show

## 2021-04-24 ENCOUNTER — Other Ambulatory Visit: Payer: Self-pay | Admitting: Physician Assistant

## 2021-04-24 DIAGNOSIS — R351 Nocturia: Secondary | ICD-10-CM

## 2021-05-13 ENCOUNTER — Other Ambulatory Visit: Payer: Self-pay | Admitting: Physician Assistant

## 2021-05-13 DIAGNOSIS — G8929 Other chronic pain: Secondary | ICD-10-CM

## 2021-05-13 DIAGNOSIS — M25562 Pain in left knee: Secondary | ICD-10-CM

## 2021-05-13 DIAGNOSIS — M170 Bilateral primary osteoarthritis of knee: Secondary | ICD-10-CM

## 2021-05-14 NOTE — Telephone Encounter (Signed)
Last written 04/12/2021 #180 with no refills Last appt 04/10/2021 Pain Contract - 03/26/2020 - needs new updated

## 2021-05-27 ENCOUNTER — Other Ambulatory Visit: Payer: Self-pay | Admitting: Physician Assistant

## 2021-05-27 DIAGNOSIS — E1142 Type 2 diabetes mellitus with diabetic polyneuropathy: Secondary | ICD-10-CM

## 2021-06-19 ENCOUNTER — Other Ambulatory Visit: Payer: Self-pay

## 2021-06-19 ENCOUNTER — Other Ambulatory Visit: Payer: Self-pay | Admitting: Physician Assistant

## 2021-06-19 DIAGNOSIS — M170 Bilateral primary osteoarthritis of knee: Secondary | ICD-10-CM

## 2021-06-19 DIAGNOSIS — G8929 Other chronic pain: Secondary | ICD-10-CM

## 2021-06-19 DIAGNOSIS — M25562 Pain in left knee: Secondary | ICD-10-CM

## 2021-06-19 DIAGNOSIS — M25561 Pain in right knee: Secondary | ICD-10-CM

## 2021-06-20 NOTE — Telephone Encounter (Signed)
Last written 05/14/2021 #180 with no refills - one month  On pain contract Last appt 04/10/2021

## 2021-06-23 ENCOUNTER — Other Ambulatory Visit: Payer: Self-pay | Admitting: Physician Assistant

## 2021-06-23 DIAGNOSIS — G629 Polyneuropathy, unspecified: Secondary | ICD-10-CM

## 2021-06-23 DIAGNOSIS — I700 Atherosclerosis of aorta: Secondary | ICD-10-CM

## 2021-07-08 ENCOUNTER — Other Ambulatory Visit: Payer: Self-pay | Admitting: Physician Assistant

## 2021-07-08 DIAGNOSIS — F419 Anxiety disorder, unspecified: Secondary | ICD-10-CM

## 2021-07-09 ENCOUNTER — Other Ambulatory Visit: Payer: Self-pay

## 2021-07-09 ENCOUNTER — Encounter: Payer: Self-pay | Admitting: Physician Assistant

## 2021-07-09 ENCOUNTER — Ambulatory Visit (INDEPENDENT_AMBULATORY_CARE_PROVIDER_SITE_OTHER): Payer: Medicare Other | Admitting: Physician Assistant

## 2021-07-09 VITALS — BP 132/68 | HR 64 | Ht 64.0 in | Wt 162.0 lb

## 2021-07-09 DIAGNOSIS — E785 Hyperlipidemia, unspecified: Secondary | ICD-10-CM | POA: Diagnosis not present

## 2021-07-09 DIAGNOSIS — E11649 Type 2 diabetes mellitus with hypoglycemia without coma: Secondary | ICD-10-CM

## 2021-07-09 DIAGNOSIS — I700 Atherosclerosis of aorta: Secondary | ICD-10-CM | POA: Diagnosis not present

## 2021-07-09 DIAGNOSIS — F419 Anxiety disorder, unspecified: Secondary | ICD-10-CM | POA: Diagnosis not present

## 2021-07-09 DIAGNOSIS — R351 Nocturia: Secondary | ICD-10-CM

## 2021-07-09 MED ORDER — BLOOD GLUCOSE MONITOR KIT: PACK | 0 refills | Status: DC

## 2021-07-09 MED ORDER — ATORVASTATIN CALCIUM 10 MG PO TABS: 10.0000 mg | ORAL_TABLET | Freq: Every day | ORAL | 0 refills | Status: DC

## 2021-07-09 MED ORDER — TAMSULOSIN HCL 0.4 MG PO CAPS: 0.4000 mg | ORAL_CAPSULE | Freq: Every day | ORAL | 3 refills | Status: DC

## 2021-07-09 MED ORDER — CLONAZEPAM 0.5 MG PO TABS: ORAL_TABLET | ORAL | 1 refills | Status: DC

## 2021-07-09 NOTE — Patient Instructions (Addendum)
Compression stockings  ?Chronic Venous Insufficiency ?Chronic venous insufficiency is a condition where the leg veins cannot effectively pump blood from the legs to the heart. This happens when the vein walls are either stretched, weakened, or damaged, or when the valves inside the vein are damaged. With the right treatment, you should be able to continue with an active life. This condition is also called venous stasis. ?What are the causes? ?Common causes of this condition include: ?High blood pressure inside the veins (venous hypertension). ?Sitting or standing too long, causing increased blood pressure in the leg veins. ?A blood clot that blocks blood flow in a vein (deep vein thrombosis, DVT). ?Inflammation of a vein (phlebitis) that causes a blood clot to form. ?Tumors in the pelvis that cause blood to back up. ?What increases the risk? ?The following factors may make you more likely to develop this condition: ?Having a family history of this condition. ?Obesity. ?Pregnancy. ?Living without enough regular physical activity or exercise (sedentary lifestyle). ?Smoking. ?Having a job that requires long periods of standing or sitting in one place. ?Being a certain age. Women in their 55s and 4s and men in their 93s are more likely to develop this condition. ?What are the signs or symptoms? ?Symptoms of this condition include: ?Veins that are enlarged, bulging, or twisted (varicose veins). ?Skin breakdown or ulcers. ?Reddened skin or dark discoloration of skin on the leg between the knee and ankle. ?Brown, smooth, tight, and painful skin just above the ankle, usually on the inside of the leg (lipodermatosclerosis). ?Swelling of the legs. ?How is this diagnosed? ?This condition may be diagnosed based on: ?Your medical history. ?A physical exam. ?Tests, such as: ?A procedure that creates an image of a blood vessel and nearby organs and provides information about blood flow through the blood vessel (duplex  ultrasound). ?A procedure that tests blood flow (plethysmography). ?A procedure that looks at the veins using X-ray and dye (venogram). ?How is this treated? ?The goals of treatment are to help you return to an active life and to minimize pain or disability. Treatment depends on the severity of your condition, and it may include: ?Wearing compression stockings. These can help relieve symptoms and help prevent your condition from getting worse. However, they do not cure the condition. ?Sclerotherapy. This procedure involves an injection of a solution that shrinks damaged veins. ?Surgery. This may involve: ?Removing a diseased vein (vein stripping). ?Cutting off blood flow through the vein (laser ablation surgery). ?Repairing or reconstructing a valve within the affected vein. ?Follow these instructions at home: ?  ?Wear compression stockings as told by your health care provider. These stockings help to prevent blood clots and reduce swelling in your legs. ?Take over-the-counter and prescription medicines only as told by your health care provider. ?Stay active by exercising, walking, or doing different activities. Ask your health care provider what activities are safe for you and how much exercise you need. ?Drink enough fluid to keep your urine pale yellow. ?Do not use any products that contain nicotine or tobacco, such as cigarettes, e-cigarettes, and chewing tobacco. If you need help quitting, ask your health care provider. ?Keep all follow-up visits as told by your health care provider. This is important. ?Contact a health care provider if you: ?Have redness, swelling, or more pain in the affected area. ?See a red streak or line that goes up or down from the affected area. ?Have skin breakdown or skin loss in the affected area, even if the breakdown is small. ?  Get an injury in the affected area. ?Get help right away if: ?You get an injury and an open wound in the affected area. ?You have: ?Severe pain that does  not get better with medicine. ?Sudden numbness or weakness in the foot or ankle below the affected area. ?Trouble moving your foot or ankle. ?A fever. ?Worse or persistent symptoms. ?Chest pain. ?Shortness of breath. ?Summary ?Chronic venous insufficiency is a condition where the leg veins cannot effectively pump blood from the legs to the heart. ?Chronic venous insufficiency occurs when the vein walls become stretched, weakened, or damaged, or when valves within the vein are damaged. ?Treatment depends on how severe your condition is. It often involves wearing compression stockings and may involve having a procedure. ?Make sure you stay active by exercising, walking, or doing different activities. Ask your health care provider what activities are safe for you and how much exercise you need. ?This information is not intended to replace advice given to you by your health care provider. Make sure you discuss any questions you have with your health care provider. ?Document Revised: 07/03/2020 Document Reviewed: 07/03/2020 ?Elsevier Patient Education ? 2022 Elsevier Inc. ? ?

## 2021-07-10 LAB — COMPLETE METABOLIC PANEL WITH GFR
AG Ratio: 1.5 (calc) (ref 1.0–2.5)
ALT: 13 U/L (ref 9–46)
AST: 14 U/L (ref 10–35)
Albumin: 4.3 g/dL (ref 3.6–5.1)
Alkaline phosphatase (APISO): 61 U/L (ref 35–144)
BUN: 19 mg/dL (ref 7–25)
CO2: 25 mmol/L (ref 20–32)
Calcium: 9.6 mg/dL (ref 8.6–10.3)
Chloride: 103 mmol/L (ref 98–110)
Creat: 1.17 mg/dL (ref 0.70–1.22)
Globulin: 2.8 g/dL (calc) (ref 1.9–3.7)
Glucose, Bld: 90 mg/dL (ref 65–139)
Potassium: 4.2 mmol/L (ref 3.5–5.3)
Sodium: 141 mmol/L (ref 135–146)
Total Bilirubin: 0.6 mg/dL (ref 0.2–1.2)
Total Protein: 7.1 g/dL (ref 6.1–8.1)
eGFR: 61 mL/min/{1.73_m2} (ref >=60)

## 2021-07-10 LAB — LIPID PANEL W/REFLEX DIRECT LDL
Cholesterol: 144 mg/dL (ref <200)
HDL: 51 mg/dL (ref >=40)
LDL Cholesterol (Calc): 77 mg/dL (calc)
Non-HDL Cholesterol (Calc): 93 mg/dL (calc) (ref <130)
Total CHOL/HDL Ratio: 2.8 (calc) (ref <5.0)
Triglycerides: 79 mg/dL (ref <150)

## 2021-07-10 LAB — PSA: PSA: 1.79 ng/mL (ref <=4.00)

## 2021-07-10 LAB — HEMOGLOBIN A1C
Hgb A1c MFr Bld: 5.8 % of total Hgb — ABNORMAL HIGH (ref <5.7)
Mean Plasma Glucose: 120 mg/dL
eAG (mmol/L): 6.6 mmol/L

## 2021-07-10 NOTE — Progress Notes (Signed)
Labs look really good.  ?Glucose has improved.  ?PSA stable.  ?Cholesterol right at goal.

## 2021-07-17 NOTE — Progress Notes (Signed)
? ?Subjective:  ? ? Patient ID: Benjamin Ramirez, male    DOB: 04/26/1935, 87 y.o.   MRN: [id number] ? ?HPI ?Patient is an 87 year old male with cardiomegaly, mitral regurgitation, hypertension, GERD, type 2 diabetes, chronic venous stasis, chronic degenerative disc disease pain who presents to the clinic for follow-up. ? ?He is accompanied by his friend. ? ?Patient is not checking his sugars.  He denies any hypoglycemic events.  He does state that he is taking his metformin.  He denies any open sores or wounds.  He is not watching his diet or exercising. ? ?Patient denies any chest pain, palpitations, headaches or vision changes. ? ?Patient continues to struggle with pain.  His tramadol refills have helped with this. ? ?His anxiety is much better on the Klonopin. ? ?.. ?Active Ambulatory Problems  ?  Diagnosis Date Noted  ? Chronic sinusitis 10/06/2011  ? Erectile dysfunction 10/06/2011  ? GERD (gastroesophageal reflux disease) 10/06/2011  ? Hiatal hernia 10/06/2011  ? S/P ERCP 09/22/2011  ? Seasonal allergies 10/06/2011  ? Spinal stenosis 05/29/2017  ? Spondylitis ([place]) 10/06/2011  ? History of myocardial infarction   ? Hypertension associated with diabetes ([place]) 05/29/2017  ? Insomnia secondary to chronic pain 05/29/2017  ? Encounter for chronic pain management 05/29/2017  ? Neurogenic claudication due to lumbar spinal stenosis 05/29/2017  ? Therapeutic opioid induced constipation 05/29/2017  ? Diet-controlled diabetes mellitus ([place]) 06/05/2017  ? Umbilical hernia without obstruction or gangrene   ? Fatty liver   ? Hearing difficulty of both ears 06/05/2017  ? Peripheral polyneuropathy 06/05/2017  ? Aortic atherosclerosis ([place]) 06/05/2017  ? Venous stasis dermatitis of both lower extremities 06/05/2017  ? Muscle atrophy of lower extremity 06/05/2017  ? Onychomycosis due to dermatophyte 06/05/2017  ? History of echocardiogram 06/05/2017  ? Cardiomegaly 06/05/2017  ? Pain management contract signed 06/05/2017  ? Mild  mitral regurgitation 06/11/2017  ? Elevated serum creatinine 02/16/2018  ? CKD (chronic kidney disease) stage 3, GFR 30-59 ml/min (HCC) 06/22/2019  ? Hyperlipidemia LDL goal <70 06/22/2019  ? Chronic pain of both knees 06/22/2019  ? Anxiety 06/22/2019  ? Multilevel degenerative disc disease 06/22/2019  ? Controlled type 2 diabetes mellitus with diabetic polyneuropathy, without long-term current use of insulin ([place]) 06/22/2019  ? Primary osteoarthritis of both knees 06/27/2019  ? Irregular heart rhythm 01/03/2020  ? Sinus arrhythmia seen on electrocardiogram 01/05/2020  ? Uncontrolled type 2 diabetes mellitus with hyperglycemia ([place]) 04/02/2020  ? GAD (generalized anxiety disorder) 06/12/2020  ? Regularly irregular pulse rhythym 03/27/2021  ? Bilateral lower extremity edema 03/27/2021  ? Dizziness 03/27/2021  ? ?Resolved Ambulatory Problems  ?  Diagnosis Date Noted  ? Diabetes mellitus with neurological manifestations, controlled ([place]) 07/26/2014  ? Myocardial infarction ([place]) 10/06/2011  ? Osteoarthritis 10/06/2011  ? Acute cystitis with hematuria 02/16/2018  ? ?Past Medical History:  ?Diagnosis Date  ? Allergy   ? Atherosclerosis of aorta ([place])   ? CAD (coronary artery disease)   ? Chronic back pain   ? Depression   ? Diabetes mellitus without complication ([place])   ? Hypertension   ? ? ?Review of Systems  ?All other systems reviewed and are negative. ? ?   ?Objective:  ? Physical Exam ?Vitals reviewed.  ?Constitutional:   ?   Appearance: Normal appearance.  ?HENT:  ?   Head: Normocephalic.  ?Cardiovascular:  ?   Rate and Rhythm: Rhythm irregular.  ?   Pulses: Normal pulses.  ?   Heart sounds: Murmur heard.  ?  Pulmonary:  ?   Effort: Pulmonary effort is normal.  ?Musculoskeletal:  ?   Right lower leg: Edema present.  ?   Left lower leg: Edema present.  ?   Comments: 2+ lower extremity pulses  ?Neurological:  ?   General: No focal deficit present.  ?   Mental Status: He is alert and oriented to person, place, and time.   ?Psychiatric:     ?   Mood and Affect: Mood normal.  ? ? ? ? ?.. ? ? ?   ?Assessment & Plan:  ?..Fausto was seen today for follow-up and diabetes. ? ?Diagnoses and all orders for this visit: ? ?Controlled type 2 diabetes mellitus with hypoglycemia, without long-term current use of insulin ([place]) ?-     Hemoglobin A1c ?-     COMPLETE METABOLIC PANEL WITH GFR ?-     blood glucose meter kit and supplies KIT; Dispense based on patient and insurance preference. Use up to four times daily as directed. Please include lancets, test strips, control solution. ? ?Hyperlipidemia LDL goal <70 ?-     Lipid Panel w/reflex Direct LDL ?-     COMPLETE METABOLIC PANEL WITH GFR ? ?Anxiety ?-     clonazePAM (KLONOPIN) 0.5 MG tablet; TAKE 1 TABLET(0.5 MG) BY MOUTH TWICE DAILY AS NEEDED FOR ANXIETY ?-     COMPLETE METABOLIC PANEL WITH GFR ? ?Aortic atherosclerosis (HCC) ?-     atorvastatin (LIPITOR) 10 MG tablet; Take 1 tablet (10 mg total) by mouth daily. ?-     COMPLETE METABOLIC PANEL WITH GFR ? ?Nocturia ?-     tamsulosin (FLOMAX) 0.4 MG CAPS capsule; Take 1 capsule (0.4 mg total) by mouth daily. ?-     PSA ? ? ?Refilled medications.  ?BP to goal.  ?Discussed not to take klonapin with tramadol due to risk of sudden death.  ?..PDMP reviewed during this encounter. ?No concerns.  ?Does not need tramadol refill at this time.  ?Fasting labs ordered.  ?Glucometer rx sent to start checking sugars. ?On statin.  ?Declines covid/flu/pneumonia vaccines.  ?Follow up in 3 months.  ? ?

## 2021-07-18 DIAGNOSIS — R0989 Other specified symptoms and signs involving the circulatory and respiratory systems: Secondary | ICD-10-CM | POA: Diagnosis not present

## 2021-07-18 DIAGNOSIS — I428 Other cardiomyopathies: Secondary | ICD-10-CM | POA: Diagnosis not present

## 2021-07-18 DIAGNOSIS — Z884 Allergy status to anesthetic agent status: Secondary | ICD-10-CM | POA: Diagnosis not present

## 2021-07-18 DIAGNOSIS — Z79899 Other long term (current) drug therapy: Secondary | ICD-10-CM | POA: Diagnosis not present

## 2021-07-18 DIAGNOSIS — R918 Other nonspecific abnormal finding of lung field: Secondary | ICD-10-CM | POA: Diagnosis not present

## 2021-07-18 DIAGNOSIS — Z881 Allergy status to other antibiotic agents status: Secondary | ICD-10-CM | POA: Diagnosis not present

## 2021-07-18 DIAGNOSIS — R6889 Other general symptoms and signs: Secondary | ICD-10-CM | POA: Diagnosis not present

## 2021-07-18 DIAGNOSIS — R7989 Other specified abnormal findings of blood chemistry: Secondary | ICD-10-CM | POA: Diagnosis not present

## 2021-07-18 DIAGNOSIS — I10 Essential (primary) hypertension: Secondary | ICD-10-CM | POA: Diagnosis not present

## 2021-07-18 DIAGNOSIS — Z743 Need for continuous supervision: Secondary | ICD-10-CM | POA: Diagnosis not present

## 2021-07-18 DIAGNOSIS — Z7984 Long term (current) use of oral hypoglycemic drugs: Secondary | ICD-10-CM | POA: Diagnosis not present

## 2021-07-18 DIAGNOSIS — E1149 Type 2 diabetes mellitus with other diabetic neurological complication: Secondary | ICD-10-CM | POA: Diagnosis not present

## 2021-07-18 DIAGNOSIS — R0789 Other chest pain: Secondary | ICD-10-CM | POA: Diagnosis not present

## 2021-07-18 DIAGNOSIS — R079 Chest pain, unspecified: Secondary | ICD-10-CM | POA: Diagnosis not present

## 2021-07-18 DIAGNOSIS — I499 Cardiac arrhythmia, unspecified: Secondary | ICD-10-CM | POA: Diagnosis not present

## 2021-07-18 DIAGNOSIS — I2510 Atherosclerotic heart disease of native coronary artery without angina pectoris: Secondary | ICD-10-CM | POA: Diagnosis not present

## 2021-07-18 DIAGNOSIS — I517 Cardiomegaly: Secondary | ICD-10-CM | POA: Diagnosis not present

## 2021-07-18 DIAGNOSIS — K219 Gastro-esophageal reflux disease without esophagitis: Secondary | ICD-10-CM | POA: Diagnosis not present

## 2021-07-18 DIAGNOSIS — I252 Old myocardial infarction: Secondary | ICD-10-CM | POA: Diagnosis not present

## 2021-07-18 DIAGNOSIS — J8410 Pulmonary fibrosis, unspecified: Secondary | ICD-10-CM | POA: Diagnosis not present

## 2021-07-18 DIAGNOSIS — J9811 Atelectasis: Secondary | ICD-10-CM | POA: Diagnosis not present

## 2021-07-18 DIAGNOSIS — R0602 Shortness of breath: Secondary | ICD-10-CM | POA: Diagnosis not present

## 2021-07-19 DIAGNOSIS — R079 Chest pain, unspecified: Secondary | ICD-10-CM | POA: Diagnosis not present

## 2021-07-19 DIAGNOSIS — I25118 Atherosclerotic heart disease of native coronary artery with other forms of angina pectoris: Secondary | ICD-10-CM | POA: Diagnosis not present

## 2021-07-20 DIAGNOSIS — I5189 Other ill-defined heart diseases: Secondary | ICD-10-CM | POA: Diagnosis not present

## 2021-07-20 DIAGNOSIS — I080 Rheumatic disorders of both mitral and aortic valves: Secondary | ICD-10-CM | POA: Diagnosis not present

## 2021-07-20 DIAGNOSIS — I10 Essential (primary) hypertension: Secondary | ICD-10-CM | POA: Diagnosis not present

## 2021-07-20 DIAGNOSIS — I517 Cardiomegaly: Secondary | ICD-10-CM | POA: Diagnosis not present

## 2021-07-20 DIAGNOSIS — R079 Chest pain, unspecified: Secondary | ICD-10-CM | POA: Diagnosis not present

## 2021-07-20 DIAGNOSIS — I25118 Atherosclerotic heart disease of native coronary artery with other forms of angina pectoris: Secondary | ICD-10-CM | POA: Diagnosis not present

## 2021-07-21 DIAGNOSIS — I10 Essential (primary) hypertension: Secondary | ICD-10-CM | POA: Diagnosis not present

## 2021-07-21 DIAGNOSIS — R079 Chest pain, unspecified: Secondary | ICD-10-CM | POA: Diagnosis not present

## 2021-07-21 DIAGNOSIS — I25118 Atherosclerotic heart disease of native coronary artery with other forms of angina pectoris: Secondary | ICD-10-CM | POA: Diagnosis not present

## 2021-07-24 ENCOUNTER — Other Ambulatory Visit: Payer: Self-pay | Admitting: Physician Assistant

## 2021-07-24 DIAGNOSIS — G8929 Other chronic pain: Secondary | ICD-10-CM

## 2021-07-24 DIAGNOSIS — M170 Bilateral primary osteoarthritis of knee: Secondary | ICD-10-CM

## 2021-07-24 NOTE — Telephone Encounter (Signed)
Last written 06/21/2021 #180 no refills. Last appt 07/09/2021 ?

## 2021-07-24 NOTE — Telephone Encounter (Signed)
PDMP reviewed during this encounter.  

## 2021-08-11 ENCOUNTER — Other Ambulatory Visit: Payer: Self-pay | Admitting: Physician Assistant

## 2021-08-11 DIAGNOSIS — I700 Atherosclerosis of aorta: Secondary | ICD-10-CM

## 2021-08-23 ENCOUNTER — Other Ambulatory Visit: Payer: Self-pay | Admitting: Physician Assistant

## 2021-08-23 DIAGNOSIS — M170 Bilateral primary osteoarthritis of knee: Secondary | ICD-10-CM

## 2021-08-23 DIAGNOSIS — G8929 Other chronic pain: Secondary | ICD-10-CM

## 2021-08-23 NOTE — Telephone Encounter (Signed)
Last written 07/24/2021 #180 no refills ?Last appt 07/09/2021 ?

## 2021-09-17 ENCOUNTER — Other Ambulatory Visit: Payer: Self-pay | Admitting: Physician Assistant

## 2021-09-17 DIAGNOSIS — G629 Polyneuropathy, unspecified: Secondary | ICD-10-CM

## 2021-09-17 DIAGNOSIS — I152 Hypertension secondary to endocrine disorders: Secondary | ICD-10-CM

## 2021-09-23 ENCOUNTER — Other Ambulatory Visit: Payer: Self-pay | Admitting: Physician Assistant

## 2021-09-23 DIAGNOSIS — M170 Bilateral primary osteoarthritis of knee: Secondary | ICD-10-CM

## 2021-09-23 DIAGNOSIS — G8929 Other chronic pain: Secondary | ICD-10-CM

## 2021-09-23 NOTE — Telephone Encounter (Signed)
Last written 08/23/2021 #180 no refills Last appt 07/09/2021

## 2021-09-24 DIAGNOSIS — E1121 Type 2 diabetes mellitus with diabetic nephropathy: Secondary | ICD-10-CM | POA: Diagnosis not present

## 2021-09-24 DIAGNOSIS — I25118 Atherosclerotic heart disease of native coronary artery with other forms of angina pectoris: Secondary | ICD-10-CM | POA: Diagnosis not present

## 2021-09-24 DIAGNOSIS — I10 Essential (primary) hypertension: Secondary | ICD-10-CM | POA: Diagnosis not present

## 2021-10-03 DIAGNOSIS — I25118 Atherosclerotic heart disease of native coronary artery with other forms of angina pectoris: Secondary | ICD-10-CM | POA: Diagnosis not present

## 2021-10-03 DIAGNOSIS — R079 Chest pain, unspecified: Secondary | ICD-10-CM | POA: Diagnosis not present

## 2021-10-03 DIAGNOSIS — E1121 Type 2 diabetes mellitus with diabetic nephropathy: Secondary | ICD-10-CM | POA: Diagnosis not present

## 2021-10-03 DIAGNOSIS — I10 Essential (primary) hypertension: Secondary | ICD-10-CM | POA: Diagnosis not present

## 2021-10-05 ENCOUNTER — Other Ambulatory Visit: Payer: Self-pay | Admitting: Physician Assistant

## 2021-10-05 DIAGNOSIS — E1142 Type 2 diabetes mellitus with diabetic polyneuropathy: Secondary | ICD-10-CM

## 2021-10-09 ENCOUNTER — Ambulatory Visit (INDEPENDENT_AMBULATORY_CARE_PROVIDER_SITE_OTHER): Payer: Medicare Other | Admitting: Physician Assistant

## 2021-10-09 ENCOUNTER — Encounter: Payer: Self-pay | Admitting: Physician Assistant

## 2021-10-09 VITALS — BP 121/47 | HR 64 | Resp 16 | Ht 64.0 in | Wt 160.0 lb

## 2021-10-09 DIAGNOSIS — E11649 Type 2 diabetes mellitus with hypoglycemia without coma: Secondary | ICD-10-CM | POA: Diagnosis not present

## 2021-10-09 DIAGNOSIS — I499 Cardiac arrhythmia, unspecified: Secondary | ICD-10-CM | POA: Diagnosis not present

## 2021-10-09 DIAGNOSIS — I700 Atherosclerosis of aorta: Secondary | ICD-10-CM | POA: Diagnosis not present

## 2021-10-09 DIAGNOSIS — W548XXA Other contact with dog, initial encounter: Secondary | ICD-10-CM

## 2021-10-09 DIAGNOSIS — E1142 Type 2 diabetes mellitus with diabetic polyneuropathy: Secondary | ICD-10-CM

## 2021-10-09 DIAGNOSIS — E785 Hyperlipidemia, unspecified: Secondary | ICD-10-CM

## 2021-10-09 DIAGNOSIS — Z23 Encounter for immunization: Secondary | ICD-10-CM | POA: Diagnosis not present

## 2021-10-09 DIAGNOSIS — W540XXA Bitten by dog, initial encounter: Secondary | ICD-10-CM

## 2021-10-09 LAB — POCT GLYCOSYLATED HEMOGLOBIN (HGB A1C): Hemoglobin A1C: 5.6 % (ref 4.0–5.6)

## 2021-10-09 MED ORDER — ACCU-CHEK GUIDE VI STRP: ORAL_STRIP | 3 refills | Status: DC

## 2021-10-09 NOTE — Progress Notes (Signed)
Established Patient Office Visit  Subjective   Patient ID: Benjamin Ramirez, male    DOB: [birth year]  Age: 87 y.o. MRN: [id number]  Chief Complaint  Patient presents with   Follow-up    HPI Pt is a 87 yo male with sinus arrhythmia, CAD, HTN, T2DM and chronic pain who presents to the clinic for medication refills.   Pt is doing well today. He has seen cardiology on 10/03/2021. They have placed in in a heart monitor.   Pt is feeling great. He is taking his glipizide with no problem. He is not checking his sugars. No open sores or wounds. No hypoglycemic events. No current CP or SOB.   He does use tramadol for his ongoing chronic low back pain.   Pt points out some dog scratches and wonders what to do about them.   .. Active Ambulatory Problems    Diagnosis Date Noted   Chronic sinusitis 10/06/2011   Erectile dysfunction 10/06/2011   GERD (gastroesophageal reflux disease) 10/06/2011   Hiatal hernia 10/06/2011   S/P ERCP 09/22/2011   Seasonal allergies 10/06/2011   Spinal stenosis 05/29/2017   Spondylitis (HCC) 10/06/2011   History of myocardial infarction    Hypertension associated with diabetes (HCC) 05/29/2017   Insomnia secondary to chronic pain 05/29/2017   Encounter for chronic pain management 05/29/2017   Neurogenic claudication due to lumbar spinal stenosis 05/29/2017   Therapeutic opioid induced constipation 05/29/2017   Diet-controlled diabetes mellitus (HCC) 06/05/2017   Umbilical hernia without obstruction or gangrene    Fatty liver    Hearing difficulty of both ears 06/05/2017   Peripheral polyneuropathy 06/05/2017   Aortic atherosclerosis (HCC) 06/05/2017   Venous stasis dermatitis of both lower extremities 06/05/2017   Muscle atrophy of lower extremity 06/05/2017   Onychomycosis due to dermatophyte 06/05/2017   History of echocardiogram 06/05/2017   Cardiomegaly 06/05/2017   Pain management contract signed 06/05/2017   Mild mitral regurgitation 06/11/2017    Elevated serum creatinine 02/16/2018   CKD (chronic kidney disease) stage 3, GFR 30-59 ml/min (HCC) 06/22/2019   Hyperlipidemia LDL goal <70 06/22/2019   Chronic pain of both knees 06/22/2019   Anxiety 06/22/2019   Multilevel degenerative disc disease 06/22/2019   Controlled type 2 diabetes mellitus with diabetic polyneuropathy, without long-term current use of insulin (HCC) 06/22/2019   Primary osteoarthritis of both knees 06/27/2019   Irregular heart rhythm 01/03/2020   Sinus arrhythmia seen on electrocardiogram 01/05/2020   Uncontrolled type 2 diabetes mellitus with hyperglycemia (HCC) 04/02/2020   GAD (generalized anxiety disorder) 06/12/2020   Regularly irregular pulse rhythym 03/27/2021   Bilateral lower extremity edema 03/27/2021   Dizziness 03/27/2021   Resolved Ambulatory Problems    Diagnosis Date Noted   Diabetes mellitus with neurological manifestations, controlled (HCC) 07/26/2014   Myocardial infarction (HCC) 10/06/2011   Osteoarthritis 10/06/2011   Acute cystitis with hematuria 02/16/2018   Past Medical History:  Diagnosis Date   Allergy    Atherosclerosis of aorta (HCC)    CAD (coronary artery disease)    Chronic back pain    Depression    Diabetes mellitus without complication (HCC)    Hypertension       ROS See HPI.    Objective:     BP (!) 121/47   Pulse 64   Resp 16   Ht 5\' 4"  (1.626 m)   Wt 160 lb (72.6 kg)   SpO2 96%   BMI 27.46 kg/m  BP Readings from Last 3 Encounters:  10/09/21 (!) 121/47  07/09/21 132/68  03/27/21 (!) 122/50   Wt Readings from Last 3 Encounters:  10/09/21 160 lb (72.6 kg)  07/09/21 162 lb (73.5 kg)  03/27/21 168 lb (76.2 kg)      Physical Exam Vitals reviewed.  Constitutional:      Appearance: Normal appearance.  HENT:     Head: Normocephalic.  Cardiovascular:     Comments: Irregular regular rhythm Pulmonary:     Effort: Pulmonary effort is normal.  Musculoskeletal:     Right lower leg: No edema.      Left lower leg: No edema.  Neurological:     General: No focal deficit present.     Mental Status: He is alert and oriented to person, place, and time.  Psychiatric:        Mood and Affect: Mood normal.      Results for orders placed or performed in visit on 10/09/21  POCT HgB A1C  Result Value Ref Range   Hemoglobin A1C 5.6 4.0 - 5.6 %   HbA1c POC (<> result, manual entry)     HbA1c, POC (prediabetic range)     HbA1c, POC (controlled diabetic range)      .Marland Kitchen Diabetic Foot Exam - Simple   Simple Foot Form Diabetic Foot exam was performed with the following findings: Yes 10/09/2021  1:51 PM  Visual Inspection No deformities, no ulcerations, no other skin breakdown bilaterally: Yes Sensation Testing Intact to touch and monofilament testing bilaterally: Yes Pulse Check Posterior Tibialis and Dorsalis pulse intact bilaterally: Yes Comments       Assessment & Plan:  Marland KitchenMarland KitchenAlija was seen today for follow-up.  Diagnoses and all orders for this visit:  Controlled type 2 diabetes mellitus with hypoglycemia, without long-term current use of insulin (HCC) -     POCT HgB A1C -     POCT UA - Microalbumin  Controlled type 2 diabetes mellitus with diabetic polyneuropathy, without long-term current use of insulin (HCC) -     glucose blood (ACCU-CHEK GUIDE) test strip; USE UP TO 4 TIMES DAILY AS  DIRECTED  Immunization due -     Cancel: Tdap vaccine greater than or equal to 7yo IM -     Pneumococcal conjugate vaccine 20-valent (Prevnar 20)  Dog scratch -     Tdap vaccine greater than or equal to 7yo IM  Regularly irregular pulse rhythym  Aortic atherosclerosis (HCC)  Hyperlipidemia LDL goal <70   A1C looks great  Continue glucotrol On statin BP looks good Normal microalbumin Foot exam good.  Needs eye exam Needs shingles vaccine at pharmacy Declines covid vaccine Pneumonia shots UTD Flu UTD.  Follow up in 3 months.   Dog scratches  Tdap given today Dicussed  bactroban as needed  Discussed tramadol Pain contract UTD .Marland KitchenPDMP reviewed during this encounter. No concerns Follow up in 3 months via [place] controlled substance database.   Pulse went up to 64 when checked by hand Continue to follow up with cardiology.     Return in about 3 months (around 01/09/2022).    Tandy Gaw, PA-C

## 2021-10-11 ENCOUNTER — Encounter: Payer: Self-pay | Admitting: Physician Assistant

## 2021-10-11 MED ORDER — ACCU-CHEK SOFTCLIX LANCETS MISC: 1 refills | Status: DC

## 2021-10-11 MED ORDER — ACCU-CHEK GUIDE VI STRP: ORAL_STRIP | 3 refills | Status: DC

## 2021-10-11 NOTE — Addendum Note (Signed)
Addended bySilvio Pate on: 10/11/2021 03:44 PM   Modules accepted: Orders

## 2021-10-14 ENCOUNTER — Other Ambulatory Visit: Payer: Self-pay | Admitting: Neurology

## 2021-10-14 DIAGNOSIS — E118 Type 2 diabetes mellitus with unspecified complications: Secondary | ICD-10-CM

## 2021-10-18 DIAGNOSIS — I25118 Atherosclerotic heart disease of native coronary artery with other forms of angina pectoris: Secondary | ICD-10-CM | POA: Diagnosis not present

## 2021-10-18 DIAGNOSIS — E1121 Type 2 diabetes mellitus with diabetic nephropathy: Secondary | ICD-10-CM | POA: Diagnosis not present

## 2021-10-18 DIAGNOSIS — I10 Essential (primary) hypertension: Secondary | ICD-10-CM | POA: Diagnosis not present

## 2021-10-19 ENCOUNTER — Other Ambulatory Visit: Payer: Self-pay | Admitting: Physician Assistant

## 2021-10-19 DIAGNOSIS — I700 Atherosclerosis of aorta: Secondary | ICD-10-CM

## 2021-10-19 DIAGNOSIS — K580 Irritable bowel syndrome with diarrhea: Secondary | ICD-10-CM

## 2021-10-24 DIAGNOSIS — I10 Essential (primary) hypertension: Secondary | ICD-10-CM | POA: Diagnosis not present

## 2021-10-24 DIAGNOSIS — E1121 Type 2 diabetes mellitus with diabetic nephropathy: Secondary | ICD-10-CM | POA: Diagnosis not present

## 2021-10-24 DIAGNOSIS — I25118 Atherosclerotic heart disease of native coronary artery with other forms of angina pectoris: Secondary | ICD-10-CM | POA: Diagnosis not present

## 2021-10-25 ENCOUNTER — Other Ambulatory Visit: Payer: Self-pay | Admitting: Physician Assistant

## 2021-10-25 DIAGNOSIS — G8929 Other chronic pain: Secondary | ICD-10-CM

## 2021-10-25 DIAGNOSIS — M170 Bilateral primary osteoarthritis of knee: Secondary | ICD-10-CM

## 2021-10-25 NOTE — Telephone Encounter (Signed)
Last written 09/23/2021 #180 (one month) Last appt 10/09/2021  On pain contract

## 2021-10-29 ENCOUNTER — Other Ambulatory Visit: Payer: Self-pay

## 2021-10-29 DIAGNOSIS — F419 Anxiety disorder, unspecified: Secondary | ICD-10-CM

## 2021-10-29 MED ORDER — CLONAZEPAM 0.5 MG PO TABS: ORAL_TABLET | ORAL | 2 refills | Status: DC

## 2021-11-20 ENCOUNTER — Ambulatory Visit (INDEPENDENT_AMBULATORY_CARE_PROVIDER_SITE_OTHER): Payer: Medicare Other | Admitting: Physician Assistant

## 2021-11-20 ENCOUNTER — Encounter: Payer: Self-pay | Admitting: Physician Assistant

## 2021-11-20 VITALS — BP 99/39 | HR 69 | Ht 64.0 in | Wt 163.0 lb

## 2021-11-20 DIAGNOSIS — I152 Hypertension secondary to endocrine disorders: Secondary | ICD-10-CM

## 2021-11-20 DIAGNOSIS — M25561 Pain in right knee: Secondary | ICD-10-CM

## 2021-11-20 DIAGNOSIS — Z0283 Encounter for blood-alcohol and blood-drug test: Secondary | ICD-10-CM | POA: Diagnosis not present

## 2021-11-20 DIAGNOSIS — G8929 Other chronic pain: Secondary | ICD-10-CM

## 2021-11-20 DIAGNOSIS — I499 Cardiac arrhythmia, unspecified: Secondary | ICD-10-CM

## 2021-11-20 DIAGNOSIS — E1159 Type 2 diabetes mellitus with other circulatory complications: Secondary | ICD-10-CM

## 2021-11-20 DIAGNOSIS — M25562 Pain in left knee: Secondary | ICD-10-CM

## 2021-11-20 DIAGNOSIS — M170 Bilateral primary osteoarthritis of knee: Secondary | ICD-10-CM

## 2021-11-20 DIAGNOSIS — F411 Generalized anxiety disorder: Secondary | ICD-10-CM

## 2021-11-20 MED ORDER — TRAMADOL HCL 50 MG PO TABS: ORAL_TABLET | ORAL | 0 refills | Status: DC

## 2021-11-20 MED ORDER — CLONAZEPAM 1 MG PO TABS: ORAL_TABLET | ORAL | 0 refills | Status: DC

## 2021-11-20 NOTE — Progress Notes (Signed)
Acute Office Visit  Subjective:     Patient ID: Benjamin Ramirez, male    DOB: March 20, 1935, 87 y.o.   MRN: [id number]  Chief Complaint  Patient presents with   Hypertension    HPI Patient is in today for medication refills and to follow up on HTN. Pt is accompanied by caregiver and friend who does most of the talking.   Pt is on pain contract for tramadol. Per caregiver and patient his pain is controlled.   Caregiver is worried about patient's anxiety and BP. States that when anxiety spikes his BP spikes. It has been as high as 185 on the top. If she gives him klonapin or xanax helps to bring BP down and helps anxiety. She feels like the .5mg  is "not enough". The hydroxyzine has not helped and makes him too sleepy. Taking cymbalta.    .. Active Ambulatory Problems    Diagnosis Date Noted   Chronic sinusitis 10/06/2011   Erectile dysfunction 10/06/2011   GERD (gastroesophageal reflux disease) 10/06/2011   Hiatal hernia 10/06/2011   S/P ERCP 09/22/2011   Seasonal allergies 10/06/2011   Spinal stenosis 05/29/2017   Spondylitis (HCC) 10/06/2011   History of myocardial infarction    Hypertension associated with diabetes (HCC) 05/29/2017   Insomnia secondary to chronic pain 05/29/2017   Encounter for chronic pain management 05/29/2017   Neurogenic claudication due to lumbar spinal stenosis 05/29/2017   Therapeutic opioid induced constipation 05/29/2017   Diet-controlled diabetes mellitus (HCC) 06/05/2017   Umbilical hernia without obstruction or gangrene    Fatty liver    Hearing difficulty of both ears 06/05/2017   Peripheral polyneuropathy 06/05/2017   Aortic atherosclerosis (HCC) 06/05/2017   Venous stasis dermatitis of both lower extremities 06/05/2017   Muscle atrophy of lower extremity 06/05/2017   Onychomycosis due to dermatophyte 06/05/2017   History of echocardiogram 06/05/2017   Cardiomegaly 06/05/2017   Pain management contract signed 06/05/2017   Mild mitral  regurgitation 06/11/2017   Elevated serum creatinine 02/16/2018   CKD (chronic kidney disease) stage 3, GFR 30-59 ml/min (HCC) 06/22/2019   Hyperlipidemia LDL goal <70 06/22/2019   Chronic pain of both knees 06/22/2019   Anxiety 06/22/2019   Multilevel degenerative disc disease 06/22/2019   Controlled type 2 diabetes mellitus with diabetic polyneuropathy, without long-term current use of insulin (HCC) 06/22/2019   Primary osteoarthritis of both knees 06/27/2019   Irregular heart rhythm 01/03/2020   Sinus arrhythmia seen on electrocardiogram 01/05/2020   Uncontrolled type 2 diabetes mellitus with hyperglycemia (HCC) 04/02/2020   GAD (generalized anxiety disorder) 06/12/2020   Regularly irregular pulse rhythym 03/27/2021   Bilateral lower extremity edema 03/27/2021   Dizziness 03/27/2021   Resolved Ambulatory Problems    Diagnosis Date Noted   Diabetes mellitus with neurological manifestations, controlled (HCC) 07/26/2014   Myocardial infarction (HCC) 10/06/2011   Osteoarthritis 10/06/2011   Acute cystitis with hematuria 02/16/2018   Past Medical History:  Diagnosis Date   Allergy    Atherosclerosis of aorta (HCC)    CAD (coronary artery disease)    Chronic back pain    Depression    Diabetes mellitus without complication (HCC)    Hypertension      ROS See HPI.      Objective:    BP (!) 99/39 (BP Location: Left Arm)   Pulse 69   Ht 5\' 4"  (1.626 m)   Wt 163 lb (73.9 kg)   SpO2 98%   BMI 27.98 kg/m  BP Readings from Last  3 Encounters:  11/20/21 (!) 99/39  10/09/21 (!) 121/47  07/09/21 132/68      Physical Exam Vitals reviewed.  Constitutional:      Appearance: Normal appearance.  HENT:     Head: Normocephalic.  Cardiovascular:     Rate and Rhythm: Normal rate. Rhythm irregular.     Pulses: Normal pulses.  Pulmonary:     Effort: Pulmonary effort is normal.     Breath sounds: Normal breath sounds.  Musculoskeletal:     Right lower leg: No edema.      Left lower leg: No edema.  Neurological:     General: No focal deficit present.     Mental Status: He is alert and oriented to person, place, and time.  Psychiatric:        Mood and Affect: Mood normal.        Assessment & Plan:  Marland KitchenMarland KitchenChick was seen today for hypertension.  Diagnoses and all orders for this visit:  Encounter for drug screening -     DRUG MONITORING, PANEL 6 WITH CONFIRMATION, URINE -     DRUG MONITOR, TRAMADOL,QN, URINE -     DM TEMPLATE -     DM TEMPLATE  Encounter for chronic pain management -     traMADol (ULTRAM) 50 MG tablet; TAKE 1 TO 2 TABLETS(50 TO 100 MG) BY MOUTH EVERY 8 HOURS AS NEEDED FOR MODERATE PAIN. MAXIMUM 6 TABLETS DAILY -     DRUG MONITORING, PANEL 6 WITH CONFIRMATION, URINE -     DRUG MONITOR, TRAMADOL,QN, URINE -     DM TEMPLATE -     DM TEMPLATE  Primary osteoarthritis of both knees -     traMADol (ULTRAM) 50 MG tablet; TAKE 1 TO 2 TABLETS(50 TO 100 MG) BY MOUTH EVERY 8 HOURS AS NEEDED FOR MODERATE PAIN. MAXIMUM 6 TABLETS DAILY  Chronic pain of both knees -     traMADol (ULTRAM) 50 MG tablet; TAKE 1 TO 2 TABLETS(50 TO 100 MG) BY MOUTH EVERY 8 HOURS AS NEEDED FOR MODERATE PAIN. MAXIMUM 6 TABLETS DAILY  Regularly irregular pulse rhythym -     traMADol (ULTRAM) 50 MG tablet; TAKE 1 TO 2 TABLETS(50 TO 100 MG) BY MOUTH EVERY 8 HOURS AS NEEDED FOR MODERATE PAIN. MAXIMUM 6 TABLETS DAILY -     DRUG MONITORING, PANEL 6 WITH CONFIRMATION, URINE -     DRUG MONITOR, TRAMADOL,QN, URINE -     DM TEMPLATE -     DM TEMPLATE  GAD (generalized anxiety disorder)  Hypertension associated with diabetes (HCC)  Other orders -     Discontinue: clonazePAM (KLONOPIN) 1 MG tablet; Take one to one-half tablet as needed for anxiety up to twice a day.    On pain contract for tramadol Refilled today .Marland KitchenPDMP reviewed during this encounter. Asking for more klonapin but lead by caregiver/friend Discussed not going to give more klonapin than 1mg  a day. Right now  .5mg  tablets up to twice a day but request 1mg  tablet and can break apart. I agreed to this.  I would like to confirm taking benzo and will drug test as caregiver said she gave him one of her xanax before he came.  Discussed risk of tramadol and benzodiazapine use.  Follow up with cardiology on BP.  Low today but reporting high home readings Low salt diet   Return in about 3 months (around 02/20/2022).  , PA-C

## 2021-11-21 LAB — DRUG MONITORING, PANEL 6 WITH CONFIRMATION, URINE
6 Acetylmorphine: NEGATIVE ng/mL (ref <10)
Alcohol Metabolites: NEGATIVE ng/mL (ref <500)
Amphetamines: NEGATIVE ng/mL (ref <500)
Barbiturates: NEGATIVE ng/mL (ref <300)
Benzodiazepines: NEGATIVE ng/mL (ref <100)
Cocaine Metabolite: NEGATIVE ng/mL (ref <150)
Creatinine: 70.4 mg/dL (ref >=20.0)
Marijuana Metabolite: NEGATIVE ng/mL (ref <20)
Methadone Metabolite: NEGATIVE ng/mL (ref <100)
Opiates: NEGATIVE ng/mL (ref <100)
Oxidant: NEGATIVE ug/mL (ref <200)
Oxycodone: NEGATIVE ng/mL (ref <100)
Phencyclidine: NEGATIVE ng/mL (ref <25)
pH: 5.1 (ref 4.5–9.0)

## 2021-11-21 LAB — DM TEMPLATE

## 2021-11-22 ENCOUNTER — Other Ambulatory Visit: Payer: Self-pay | Admitting: Physician Assistant

## 2021-11-22 LAB — DM TEMPLATE

## 2021-11-22 LAB — DRUG MONITOR, TRAMADOL,QN, URINE
Desmethyltramadol: 2127 ng/mL — ABNORMAL HIGH (ref <100)
Tramadol: >10000 ng/mL — ABNORMAL HIGH (ref <100)

## 2021-11-22 NOTE — Progress Notes (Signed)
Can you add testing for tramadol?   No benzo in urine but being prescribed klonapin daily and reported taking. This is a fail of controlled substance contract. We will no longer be able to prescribe ANY klonapin in this office.

## 2021-11-22 NOTE — Progress Notes (Signed)
Will continue to prescribe tramadol but no benzo.

## 2021-12-04 ENCOUNTER — Other Ambulatory Visit: Payer: Self-pay | Admitting: Physician Assistant

## 2021-12-04 DIAGNOSIS — E1142 Type 2 diabetes mellitus with diabetic polyneuropathy: Secondary | ICD-10-CM

## 2021-12-11 ENCOUNTER — Other Ambulatory Visit: Payer: Self-pay | Admitting: Physician Assistant

## 2021-12-11 DIAGNOSIS — M539 Dorsopathy, unspecified: Secondary | ICD-10-CM

## 2021-12-11 DIAGNOSIS — M4696 Unspecified inflammatory spondylopathy, lumbar region: Secondary | ICD-10-CM

## 2021-12-11 DIAGNOSIS — G8929 Other chronic pain: Secondary | ICD-10-CM

## 2021-12-11 DIAGNOSIS — Z0289 Encounter for other administrative examinations: Secondary | ICD-10-CM

## 2021-12-11 DIAGNOSIS — M48062 Spinal stenosis, lumbar region with neurogenic claudication: Secondary | ICD-10-CM

## 2021-12-11 DIAGNOSIS — G629 Polyneuropathy, unspecified: Secondary | ICD-10-CM

## 2021-12-16 ENCOUNTER — Other Ambulatory Visit: Payer: Self-pay | Admitting: Physician Assistant

## 2021-12-19 ENCOUNTER — Telehealth: Payer: Self-pay | Admitting: Neurology

## 2021-12-19 NOTE — Telephone Encounter (Signed)
Benjamin Ramirez called to inquire about clonazepam refill x2 LVM.   Called her back and let her know [name] will not RX any other benzo's for patient. He was informed on 11/22/2021.   She states that he didn't have clonazepam in his system because he had to take more than prescribed and ran out early. When I let her know she did inform us he took it that day, she states that he only took Xanax. Negative for any benzo's in the system. Made her aware no more will be prescribed. She expressed understanding.

## 2021-12-20 ENCOUNTER — Other Ambulatory Visit: Payer: Self-pay | Admitting: Family Medicine

## 2021-12-20 DIAGNOSIS — I499 Cardiac arrhythmia, unspecified: Secondary | ICD-10-CM

## 2021-12-20 DIAGNOSIS — G8929 Other chronic pain: Secondary | ICD-10-CM

## 2021-12-20 DIAGNOSIS — M170 Bilateral primary osteoarthritis of knee: Secondary | ICD-10-CM

## 2021-12-23 NOTE — Telephone Encounter (Signed)
..  PDMP reviewed during this encounter. No concerns in fills.  UTD pain contract and appt.

## 2021-12-31 ENCOUNTER — Ambulatory Visit: Payer: Medicare Other | Admitting: Physician Assistant

## 2022-01-14 ENCOUNTER — Ambulatory Visit: Payer: Medicare Other | Admitting: Physician Assistant

## 2022-01-31 ENCOUNTER — Ambulatory Visit: Payer: Medicare Other | Admitting: Medical-Surgical

## 2022-02-02 IMAGING — DX DG LUMBAR SPINE COMPLETE 4+V
5 series · 5 of 5 positions shown · non-contrast
Comparison: None.

CLINICAL DATA: Chronic lower back pain.

EXAM:
LUMBAR SPINE - COMPLETE 4+ VIEW

[l-spine ap]
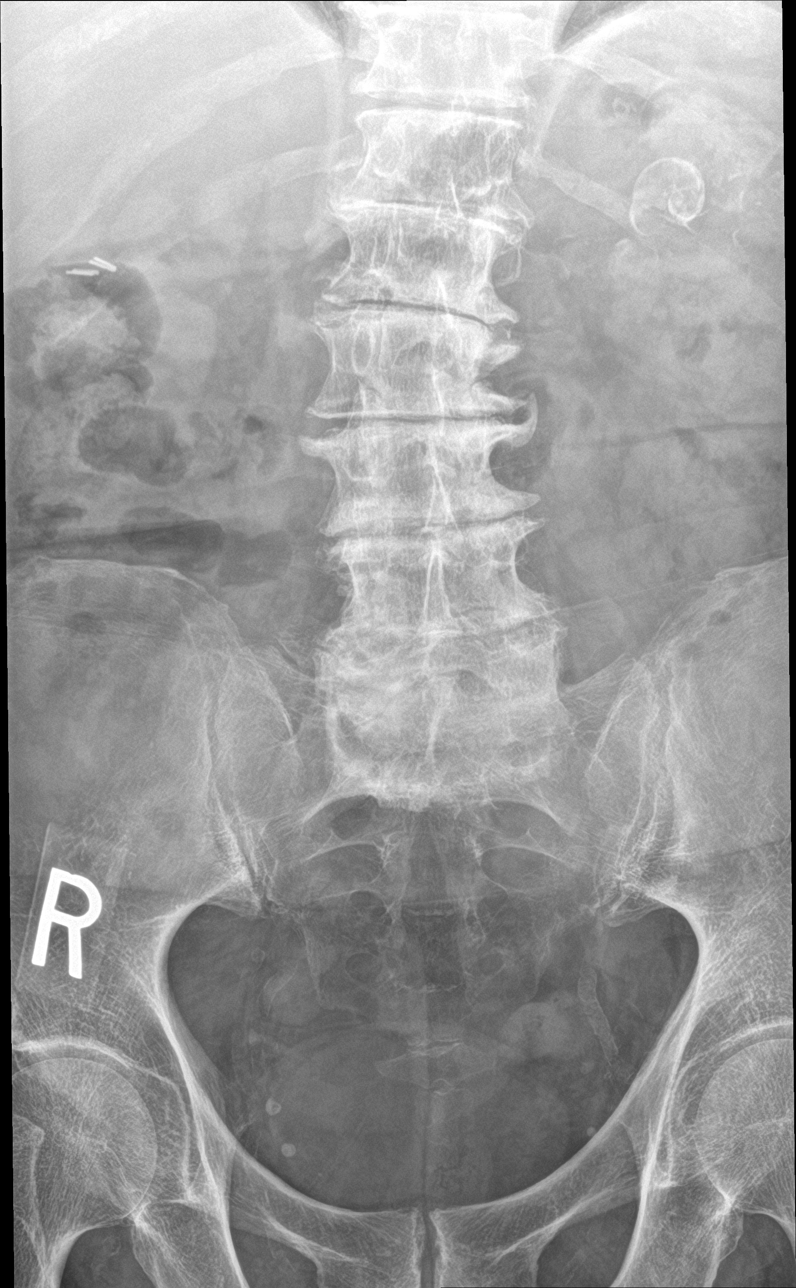

[l-spine obl (1 of 2)]
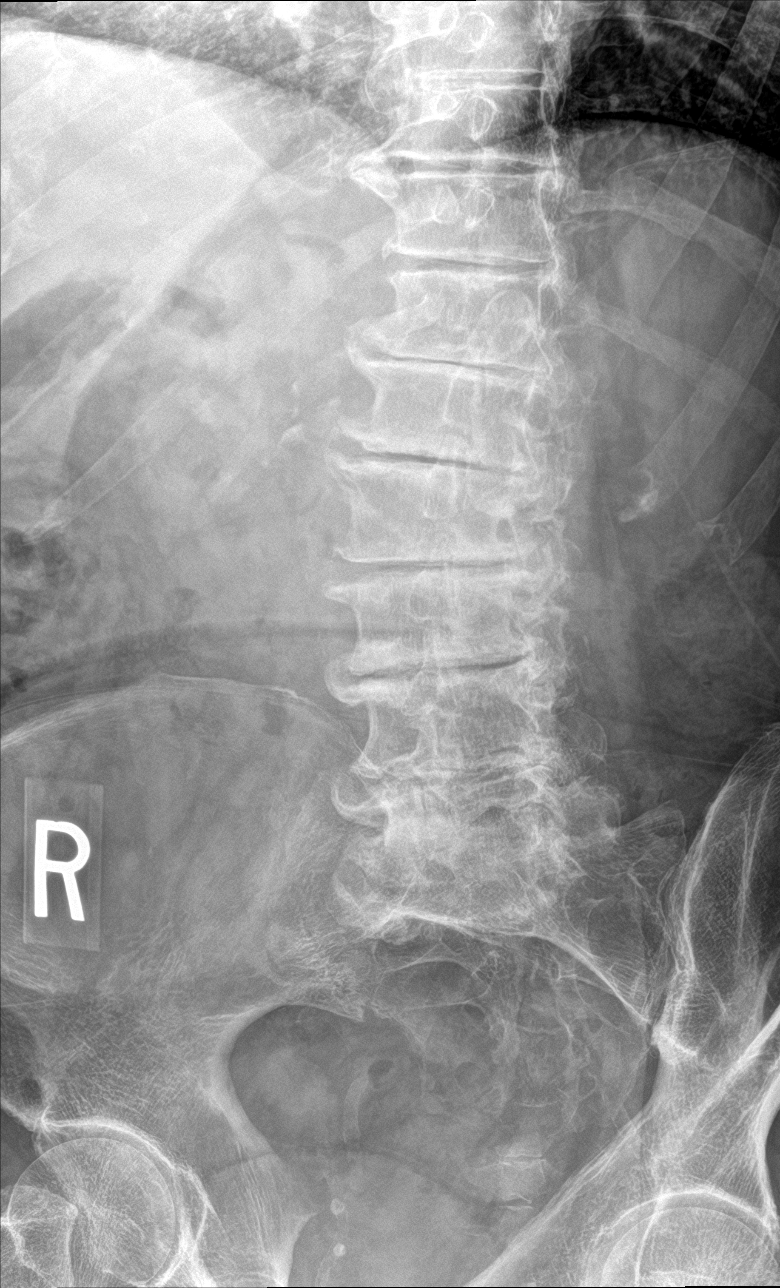

[l-spine obl (2 of 2)]
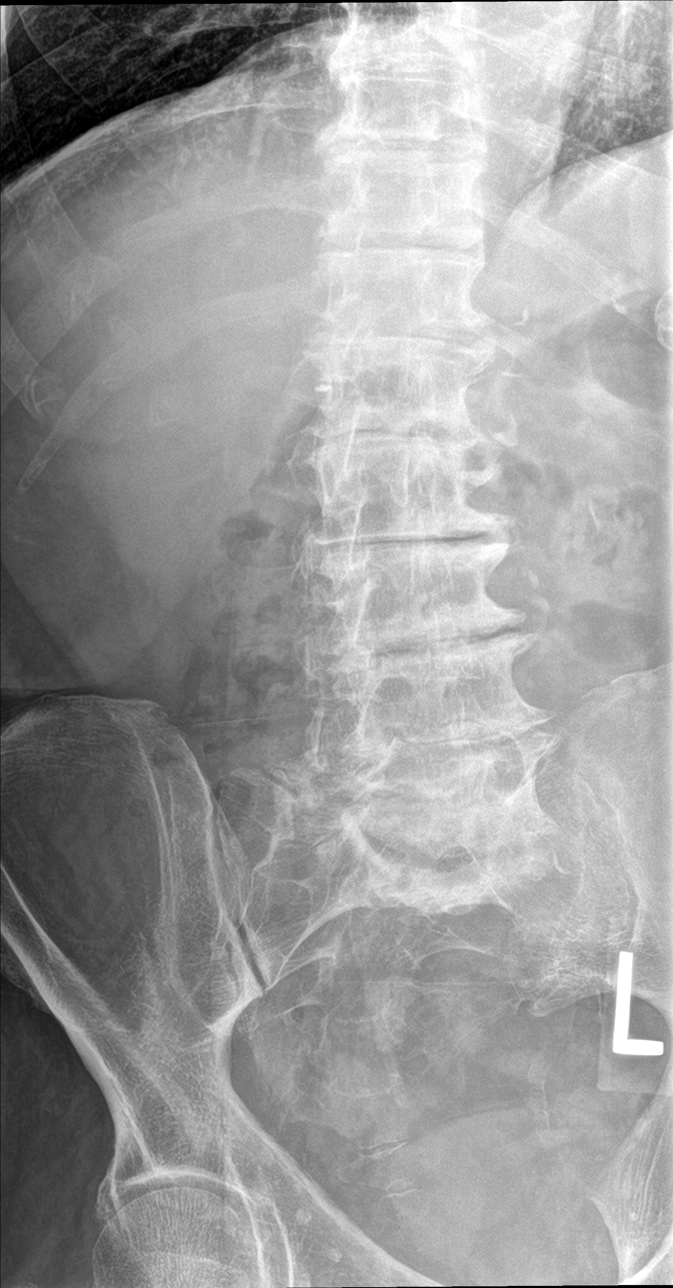

[l-spine lat]
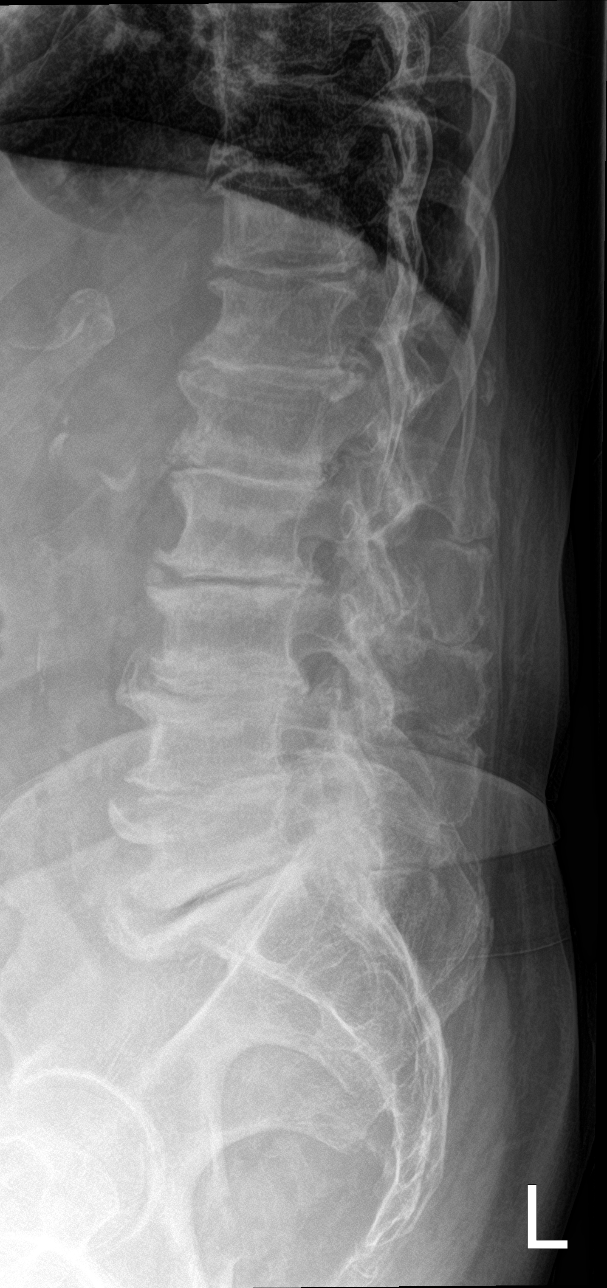

[l-spine spot]
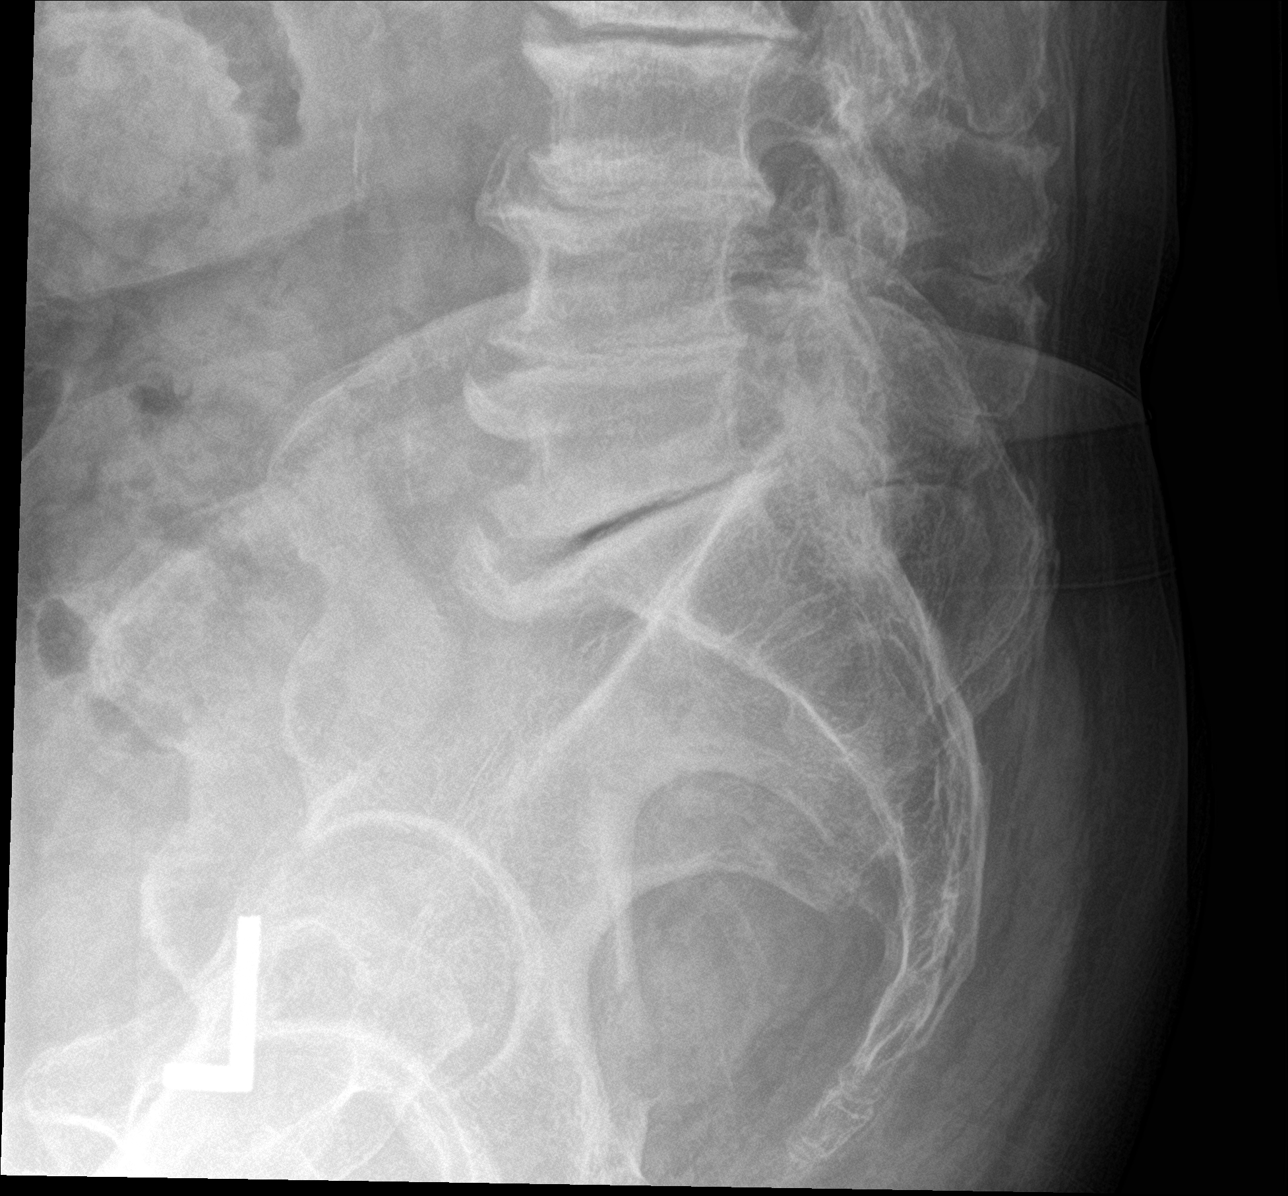

[5 of 5 positions shown; findings below may reference images not displayed]

FINDINGS: No fracture or significant spondylolisthesis is noted. Severe disc
space narrowing with anterior osteophyte formation is noted at all
levels of the lumbar spine.
IMPRESSION: Severe multilevel degenerative disc disease. No acute abnormality
seen in the lumbar spine.

## 2022-02-11 ENCOUNTER — Telehealth: Payer: Self-pay

## 2022-02-11 DIAGNOSIS — M170 Bilateral primary osteoarthritis of knee: Secondary | ICD-10-CM

## 2022-02-11 DIAGNOSIS — G8929 Other chronic pain: Secondary | ICD-10-CM

## 2022-02-11 DIAGNOSIS — I499 Cardiac arrhythmia, unspecified: Secondary | ICD-10-CM

## 2022-02-11 MED ORDER — TRAMADOL HCL 50 MG PO TABS: ORAL_TABLET | ORAL | 0 refills | Status: DC

## 2022-02-11 NOTE — Addendum Note (Signed)
Addended by: Beatrice Lecher D on: 02/11/2022 05:45 PM   Modules accepted: Orders

## 2022-02-11 NOTE — Telephone Encounter (Signed)
Routing to covering provider -   Patient's spouse called on behalf of patient. Requesting a med refill for tramadol. Per spouse, patient is completely out of medication. Rx pended.   Last OV: 11/20/21 Next OV: 02/19/22 Last RF: 12/23/21

## 2022-02-11 NOTE — Telephone Encounter (Signed)
Meds ordered this encounter  Medications   traMADol (ULTRAM) 50 MG tablet    Sig: TAKE 1 TO 2 TABLETS(50 TO 100 MG) BY MOUTH EVERY 8 HOURS AS NEEDED FOR MODERATE PAIN. MAXIMUM 6 TABLETS DAILY    Dispense:  180 tablet    Refill:  0

## 2022-02-19 ENCOUNTER — Ambulatory Visit: Payer: Medicare Other | Admitting: Physician Assistant

## 2022-02-19 DIAGNOSIS — E11649 Type 2 diabetes mellitus with hypoglycemia without coma: Secondary | ICD-10-CM

## 2022-03-17 ENCOUNTER — Telehealth: Payer: Self-pay

## 2022-03-17 NOTE — Telephone Encounter (Signed)
Patient's girlfriend also left a vm on my line stating that she "thought he took Xanax" the morning he was here for drug testing but he lied to her.   He tested negative for benzo's in his drug screen in July and was told we would no longer prescribe. Please advise.

## 2022-03-17 NOTE — Telephone Encounter (Signed)
Please call patient/family to schedule appt to discuss.

## 2022-03-17 NOTE — Telephone Encounter (Signed)
Patient caregiver-  Marzetta Merino called for pat States he woke up having a break down today.  States he has extreme sadness during Thanksgiving and Christmas. She states she can not get him to eat wither-  states he hides his emotions while in the office and things appear fine.  She states she will call and schedule him an appointment but is wanting to know if can get a refill of Klonopin before he is seen . She states " she can not give him any more of her medication" . She states she is at her "wits end" - contact # [phone].caregiver requesting a return call.

## 2022-03-18 NOTE — Telephone Encounter (Signed)
Called patient at 9:16AM 11/14, was directed by individual who answered to call back later. Benjamin Ramirez

## 2022-03-19 NOTE — Telephone Encounter (Signed)
Called and scheduled patient for Friday at 1PM. [facility]

## 2022-03-21 ENCOUNTER — Encounter: Payer: Self-pay | Admitting: Physician Assistant

## 2022-03-21 ENCOUNTER — Telehealth (INDEPENDENT_AMBULATORY_CARE_PROVIDER_SITE_OTHER): Payer: Medicare Other | Admitting: Physician Assistant

## 2022-03-21 DIAGNOSIS — F411 Generalized anxiety disorder: Secondary | ICD-10-CM

## 2022-03-21 DIAGNOSIS — G8929 Other chronic pain: Secondary | ICD-10-CM | POA: Diagnosis not present

## 2022-03-21 MED ORDER — CLONAZEPAM 1 MG PO TABS: ORAL_TABLET | ORAL | 0 refills | Status: DC

## 2022-03-21 NOTE — Progress Notes (Unsigned)
Discuss non-controlled anxiety medications

## 2022-03-21 NOTE — Progress Notes (Unsigned)
..Virtual Visit via Telephone Note  I connected with Benjamin Ramirez on 03/24/22 at  1:00 PM EST by telephone and verified that I am speaking with the correct person using two identifiers.  Location: Patient: home Provider: clinic  .Marland KitchenParticipating in visit:  Patient: Benjamin Ramirez Provider: Tandy Gaw PA-C    I discussed the limitations, risks, security and privacy concerns of performing an evaluation and management service by telephone and the availability of in person appointments. I also discussed with the patient that there may be a patient responsible charge related to this service. The patient expressed understanding and agreed to proceed.   History of Present Illness: Pt is a 87 yo male with chronic pain, GAD, Aortic Atherosclerosis, HTN, GERD, T2DM who calls into the clinic to discuss anxiety.   He has tried [employer], buspar, hydroxyzine for anxiety and not helpful. He is on gabapentin for pain and anxiety. Klonapin 1/2 tablet of 1mg  twice a day works the best for him. His urine was checked at last visit and no benzos were found. Prescriptions were stopped. He stated he was out and did not think to tell me. He is requesting to restart klonapin.    .. Active Ambulatory Problems    Diagnosis Date Noted   Chronic sinusitis 10/06/2011   Erectile dysfunction 10/06/2011   GERD (gastroesophageal reflux disease) 10/06/2011   Hiatal hernia 10/06/2011   S/P ERCP 09/22/2011   Seasonal allergies 10/06/2011   Spinal stenosis 05/29/2017   Spondylitis (HCC) 10/06/2011   History of myocardial infarction    Hypertension associated with diabetes (HCC) 05/29/2017   Insomnia secondary to chronic pain 05/29/2017   Encounter for chronic pain management 05/29/2017   Neurogenic claudication due to lumbar spinal stenosis 05/29/2017   Therapeutic opioid induced constipation 05/29/2017   Diet-controlled diabetes mellitus (HCC) 06/05/2017   Umbilical hernia without obstruction or gangrene    Fatty liver     Hearing difficulty of both ears 06/05/2017   Peripheral polyneuropathy 06/05/2017   Aortic atherosclerosis (HCC) 06/05/2017   Venous stasis dermatitis of both lower extremities 06/05/2017   Muscle atrophy of lower extremity 06/05/2017   Onychomycosis due to dermatophyte 06/05/2017   History of echocardiogram 06/05/2017   Cardiomegaly 06/05/2017   Pain management contract signed 06/05/2017   Mild mitral regurgitation 06/11/2017   Elevated serum creatinine 02/16/2018   CKD (chronic kidney disease) stage 3, GFR 30-59 ml/min (HCC) 06/22/2019   Hyperlipidemia LDL goal <70 06/22/2019   Chronic pain of both knees 06/22/2019   Anxiety 06/22/2019   Multilevel degenerative disc disease 06/22/2019   Controlled type 2 diabetes mellitus with diabetic polyneuropathy, without long-term current use of insulin (HCC) 06/22/2019   Primary osteoarthritis of both knees 06/27/2019   Irregular heart rhythm 01/03/2020   Sinus arrhythmia seen on electrocardiogram 01/05/2020   Uncontrolled type 2 diabetes mellitus with hyperglycemia (HCC) 04/02/2020   GAD (generalized anxiety disorder) 06/12/2020   Regularly irregular pulse rhythym 03/27/2021   Bilateral lower extremity edema 03/27/2021   Dizziness 03/27/2021   Resolved Ambulatory Problems    Diagnosis Date Noted   Diabetes mellitus with neurological manifestations, controlled (HCC) 07/26/2014   Myocardial infarction (HCC) 10/06/2011   Osteoarthritis 10/06/2011   Acute cystitis with hematuria 02/16/2018   Past Medical History:  Diagnosis Date   Allergy    Atherosclerosis of aorta (HCC)    CAD (coronary artery disease)    Chronic back pain    Depression    Diabetes mellitus without complication (HCC)    Hypertension  Observations/Objective: No acute distress Normal breathing    Assessment and Plan: Marland KitchenMarland KitchenSrijan was seen today for anxiety.  Diagnoses and all orders for this visit:  Encounter for chronic pain management  GAD (generalized  anxiety disorder) -     clonazePAM (KLONOPIN) 1 MG tablet; Take one-half tablet twice a day.   Marland Kitchen.PDMP reviewed during this encounter. Discussed NOT to take with tramadol Will restart and recheck urine at next visit to make sure in urine Follow up in 1 month.    Follow Up Instructions:    I discussed the assessment and treatment plan with the patient. The patient was provided an opportunity to ask questions and all were answered. The patient agreed with the plan and demonstrated an understanding of the instructions.   The patient was advised to call back or seek an in-person evaluation if the symptoms worsen or if the condition fails to improve as anticipated.  I provided 10 minutes of non-face-to-face time during this encounter.   Tandy Gaw, PA-C

## 2022-04-15 ENCOUNTER — Ambulatory Visit: Payer: Medicare Other | Admitting: Physician Assistant

## 2022-04-18 ENCOUNTER — Other Ambulatory Visit: Payer: Self-pay | Admitting: Neurology

## 2022-04-18 DIAGNOSIS — G8929 Other chronic pain: Secondary | ICD-10-CM

## 2022-04-18 DIAGNOSIS — M170 Bilateral primary osteoarthritis of knee: Secondary | ICD-10-CM

## 2022-04-18 DIAGNOSIS — I499 Cardiac arrhythmia, unspecified: Secondary | ICD-10-CM

## 2022-04-18 DIAGNOSIS — F411 Generalized anxiety disorder: Secondary | ICD-10-CM

## 2022-04-18 MED ORDER — CLONAZEPAM 1 MG PO TABS: ORAL_TABLET | ORAL | 0 refills | Status: DC

## 2022-04-18 MED ORDER — TRAMADOL HCL 50 MG PO TABS: ORAL_TABLET | ORAL | 0 refills | Status: DC

## 2022-04-18 NOTE — Telephone Encounter (Signed)
[facility] called for patient asking for refills on Tramadol and Clonazepam. They are going out of town.   Tramadol 02/11/2022 #180 no refills  Clonazepam 03/21/2022 #30 no refills  Last appt 03/21/2022  Next visit scheduled 04/29/2022

## 2022-04-29 ENCOUNTER — Encounter: Payer: Self-pay | Admitting: Physician Assistant

## 2022-04-29 ENCOUNTER — Ambulatory Visit (INDEPENDENT_AMBULATORY_CARE_PROVIDER_SITE_OTHER): Payer: Medicare Other | Admitting: Physician Assistant

## 2022-04-29 VITALS — BP 135/57 | HR 49 | Ht 64.0 in | Wt 158.0 lb

## 2022-04-29 DIAGNOSIS — E1142 Type 2 diabetes mellitus with diabetic polyneuropathy: Secondary | ICD-10-CM | POA: Diagnosis not present

## 2022-04-29 DIAGNOSIS — K580 Irritable bowel syndrome with diarrhea: Secondary | ICD-10-CM

## 2022-04-29 DIAGNOSIS — I499 Cardiac arrhythmia, unspecified: Secondary | ICD-10-CM

## 2022-04-29 DIAGNOSIS — M2550 Pain in unspecified joint: Secondary | ICD-10-CM

## 2022-04-29 DIAGNOSIS — M1991 Primary osteoarthritis, unspecified site: Secondary | ICD-10-CM

## 2022-04-29 DIAGNOSIS — E1159 Type 2 diabetes mellitus with other circulatory complications: Secondary | ICD-10-CM | POA: Diagnosis not present

## 2022-04-29 DIAGNOSIS — G8929 Other chronic pain: Secondary | ICD-10-CM | POA: Diagnosis not present

## 2022-04-29 DIAGNOSIS — I152 Hypertension secondary to endocrine disorders: Secondary | ICD-10-CM | POA: Diagnosis not present

## 2022-04-29 DIAGNOSIS — Z23 Encounter for immunization: Secondary | ICD-10-CM | POA: Diagnosis not present

## 2022-04-29 LAB — POCT GLYCOSYLATED HEMOGLOBIN (HGB A1C): Hemoglobin A1C: 6.3 % — AB (ref 4.0–5.6)

## 2022-04-29 MED ORDER — HYDROCHLOROTHIAZIDE 12.5 MG PO TABS: 12.5000 mg | ORAL_TABLET | Freq: Every day | ORAL | 3 refills | Status: DC

## 2022-04-29 MED ORDER — GLIPIZIDE ER 5 MG PO TB24: 5.0000 mg | ORAL_TABLET | Freq: Every day | ORAL | 3 refills | Status: DC

## 2022-04-29 MED ORDER — DICYCLOMINE HCL 10 MG PO CAPS: ORAL_CAPSULE | ORAL | 3 refills | Status: DC

## 2022-04-29 MED ORDER — COLESEVELAM HCL 3.75 G PO PACK: 1.0000 | PACK | Freq: Three times a day (TID) | ORAL | 0 refills | Status: DC

## 2022-04-29 NOTE — Progress Notes (Signed)
Established Patient Office Visit  Subjective   Patient ID: Benjamin Ramirez, male    DOB: [birth year]  Age: 87 y.o. MRN: [id number]  Chief Complaint  Patient presents with   Follow-up    HPI Pt is a 87 yo male with T2DM, Chronic pain, IBS-D, and  Anxiety who presents to the clinic for refills. Pt's caregiver is with him.   [name] was refilled on 12/17 and tramadol 12/15.   Pt reports not checking sugars. He is compliant with medications. He is not dieting or exercising. No open sores or wounds. No hypoglycemic events. No CP, palpitations, headaches or vision changes.   Continues to have loose stools despite taking bentyl. No abdominal pain.   Continues to have chronic pain. Using tramadol.   .. Active Ambulatory Problems    Diagnosis Date Noted   Chronic sinusitis 10/06/2011   Erectile dysfunction 10/06/2011   GERD (gastroesophageal reflux disease) 10/06/2011   Hiatal hernia 10/06/2011   S/P ERCP 09/22/2011   Seasonal allergies 10/06/2011   Spinal stenosis 05/29/2017   Spondylitis (HCC) 10/06/2011   History of myocardial infarction    Hypertension associated with diabetes (HCC) 05/29/2017   Insomnia secondary to chronic pain 05/29/2017   Encounter for chronic pain management 05/29/2017   Neurogenic claudication due to lumbar spinal stenosis 05/29/2017   Therapeutic opioid induced constipation 05/29/2017   Diet-controlled diabetes mellitus (HCC) 06/05/2017   Umbilical hernia without obstruction or gangrene    Fatty liver    Hearing difficulty of both ears 06/05/2017   Peripheral polyneuropathy 06/05/2017   Aortic atherosclerosis (HCC) 06/05/2017   Venous stasis dermatitis of both lower extremities 06/05/2017   Muscle atrophy of lower extremity 06/05/2017   Onychomycosis due to dermatophyte 06/05/2017   History of echocardiogram 06/05/2017   Cardiomegaly 06/05/2017   Pain management contract signed 06/05/2017   Mild mitral regurgitation 06/11/2017   Elevated serum  creatinine 02/16/2018   CKD (chronic kidney disease) stage 3, GFR 30-59 ml/min (HCC) 06/22/2019   Hyperlipidemia LDL goal <70 06/22/2019   Chronic pain of both knees 06/22/2019   Anxiety 06/22/2019   Multilevel degenerative disc disease 06/22/2019   Controlled type 2 diabetes mellitus with diabetic polyneuropathy, without long-term current use of insulin (HCC) 06/22/2019   Primary osteoarthritis of both knees 06/27/2019   Irregular heart rhythm 01/03/2020   Sinus arrhythmia seen on electrocardiogram 01/05/2020   Uncontrolled type 2 diabetes mellitus with hyperglycemia (HCC) 04/02/2020   GAD (generalized anxiety disorder) 06/12/2020   Regularly irregular pulse rhythym 03/27/2021   Bilateral lower extremity edema 03/27/2021   Dizziness 03/27/2021   Primary osteoarthritis 04/29/2022   Irritable bowel syndrome with diarrhea 04/29/2022   Resolved Ambulatory Problems    Diagnosis Date Noted   Diabetes mellitus with neurological manifestations, controlled (HCC) 07/26/2014   Myocardial infarction (HCC) 10/06/2011   Osteoarthritis 10/06/2011   Acute cystitis with hematuria 02/16/2018   Past Medical History:  Diagnosis Date   Allergy    Atherosclerosis of aorta (HCC)    CAD (coronary artery disease)    Chronic back pain    Depression    Diabetes mellitus without complication (HCC)    Hypertension      ROS See HPI.    Objective:     BP (!) 135/57   Pulse (!) 49   Ht 5\' 4"  (1.626 m)   Wt 158 lb (71.7 kg)   SpO2 96%   BMI 27.12 kg/m  BP Readings from Last 3 Encounters:  04/29/22 (!) 135/57  11/20/21 11/22/21)  99/39  10/09/21 (!) 121/47   Wt Readings from Last 3 Encounters:  04/29/22 158 lb (71.7 kg)  11/20/21 163 lb (73.9 kg)  10/09/21 160 lb (72.6 kg)    .Marland Kitchen Lab Results  Component Value Date   HGBA1C 6.3 (A) 04/29/2022     Physical Exam Constitutional:      Appearance: Normal appearance.  HENT:     Head: Normocephalic.  Cardiovascular:     Rate and Rhythm: Normal  rate. Rhythm irregular.  Pulmonary:     Effort: Pulmonary effort is normal.     Breath sounds: Normal breath sounds.  Musculoskeletal:     Right lower leg: No edema.     Left lower leg: No edema.  Neurological:     General: No focal deficit present.     Mental Status: He is alert and oriented to person, place, and time.  Psychiatric:        Mood and Affect: Mood normal.        Assessment & Plan:  Marland KitchenMarland KitchenTanush was seen today for follow-up.  Diagnoses and all orders for this visit:  Controlled type 2 diabetes mellitus with diabetic polyneuropathy, without long-term current use of insulin (HCC) -     POCT glycosylated hemoglobin (Hb A1C) -     Discontinue: glipiZIDE (GLUCOTROL XL) 5 MG 24 hr tablet; Take 1 tablet (5 mg total) by mouth daily with breakfast.  Hypertension associated with diabetes (HCC) -     Discontinue: hydrochlorothiazide (HYDRODIURIL) 12.5 MG tablet; Take 1 tablet (12.5 mg total) by mouth daily.  Regularly irregular pulse rhythym -     Discontinue: hydrochlorothiazide (HYDRODIURIL) 12.5 MG tablet; Take 1 tablet (12.5 mg total) by mouth daily.  Irritable bowel syndrome with diarrhea -     Discontinue: dicyclomine (BENTYL) 10 MG capsule; TAKE 1 CAPSULE BY MOUTH  TWICE DAILY BEFORE MEALS -     Colesevelam HCl 3.75 g PACK; Take 1 packet by mouth in the morning, at noon, and at bedtime.  Primary osteoarthritis, unspecified site  Arthralgia, unspecified joint -     ANA Screen,IFA,Reflex Titer/Pattern,Reflex Mplx 11 Ab Cascade with IdentRA -     COMPLETE METABOLIC PANEL WITH GFR  Need for immunization against influenza -     Flu Vaccine QUAD High Dose(Fluad)   A1c to goal Continue same medications BP close to goal, cmp ordered, start lisinopril  On statin Needs eye exam and kidney function tested Declined vaccines Follow up in 3 months   IBS-D Stop bentyl Trial of colesevelam   Arthralgia Ana ordered  On pain contract Tramadol not due for  refills   Tandy Gaw, PA-C

## 2022-04-29 NOTE — Patient Instructions (Signed)
Start cholestyramine with meals for diarrhea

## 2022-04-30 NOTE — Progress Notes (Signed)
Stable kidney function from 1 year ago.

## 2022-05-03 LAB — COMPLETE METABOLIC PANEL WITH GFR
AG Ratio: 1.5 (calc) (ref 1.0–2.5)
ALT: 11 U/L (ref 9–46)
AST: 15 U/L (ref 10–35)
Albumin: 4.1 g/dL (ref 3.6–5.1)
Alkaline phosphatase (APISO): 54 U/L (ref 35–144)
BUN/Creatinine Ratio: 16 (calc) (ref 6–22)
BUN: 21 mg/dL (ref 7–25)
CO2: 27 mmol/L (ref 20–32)
Calcium: 9.3 mg/dL (ref 8.6–10.3)
Chloride: 107 mmol/L (ref 98–110)
Creat: 1.33 mg/dL — ABNORMAL HIGH (ref 0.70–1.22)
Globulin: 2.7 g/dL (calc) (ref 1.9–3.7)
Glucose, Bld: 105 mg/dL — ABNORMAL HIGH (ref 65–99)
Potassium: 4.1 mmol/L (ref 3.5–5.3)
Sodium: 140 mmol/L (ref 135–146)
Total Bilirubin: 0.7 mg/dL (ref 0.2–1.2)
Total Protein: 6.8 g/dL (ref 6.1–8.1)
eGFR: 52 mL/min/{1.73_m2} — ABNORMAL LOW (ref >=60)

## 2022-05-03 LAB — ANA SCREEN,IFA,REFLEX TITER/PATTERN,REFLEX MPLX 11 AB CASCADE
Anti Nuclear Antibody (ANA): NEGATIVE
Cyclic Citrullin Peptide Ab: <16 UNITS
MUTATED CITRULLINATED VIMENTIN (MCV) AB: <20 U/mL (ref <20)
Rheumatoid fact SerPl-aCnc: <14 IU/mL (ref <14)

## 2022-05-06 NOTE — Progress Notes (Signed)
ANA negative. No autoimmune findings for arthritic pain and joint stiffness.

## 2022-05-09 ENCOUNTER — Other Ambulatory Visit: Payer: Self-pay | Admitting: Physician Assistant

## 2022-05-09 ENCOUNTER — Other Ambulatory Visit: Payer: Self-pay | Admitting: Neurology

## 2022-05-09 DIAGNOSIS — Z79899 Other long term (current) drug therapy: Secondary | ICD-10-CM | POA: Diagnosis not present

## 2022-05-09 DIAGNOSIS — Z2914 Encounter for prophylactic rabies immune globin: Secondary | ICD-10-CM | POA: Diagnosis not present

## 2022-05-09 DIAGNOSIS — M1990 Unspecified osteoarthritis, unspecified site: Secondary | ICD-10-CM | POA: Diagnosis not present

## 2022-05-09 DIAGNOSIS — M170 Bilateral primary osteoarthritis of knee: Secondary | ICD-10-CM

## 2022-05-09 DIAGNOSIS — S61412A Laceration without foreign body of left hand, initial encounter: Secondary | ICD-10-CM | POA: Diagnosis not present

## 2022-05-09 DIAGNOSIS — G629 Polyneuropathy, unspecified: Secondary | ICD-10-CM

## 2022-05-09 DIAGNOSIS — I499 Cardiac arrhythmia, unspecified: Secondary | ICD-10-CM

## 2022-05-09 DIAGNOSIS — I152 Hypertension secondary to endocrine disorders: Secondary | ICD-10-CM

## 2022-05-09 DIAGNOSIS — E1142 Type 2 diabetes mellitus with diabetic polyneuropathy: Secondary | ICD-10-CM

## 2022-05-09 DIAGNOSIS — Z203 Contact with and (suspected) exposure to rabies: Secondary | ICD-10-CM | POA: Diagnosis not present

## 2022-05-09 DIAGNOSIS — S61411A Laceration without foreign body of right hand, initial encounter: Secondary | ICD-10-CM | POA: Diagnosis not present

## 2022-05-09 DIAGNOSIS — I700 Atherosclerosis of aorta: Secondary | ICD-10-CM

## 2022-05-09 DIAGNOSIS — S61452A Open bite of left hand, initial encounter: Secondary | ICD-10-CM | POA: Diagnosis not present

## 2022-05-09 DIAGNOSIS — S61451A Open bite of right hand, initial encounter: Secondary | ICD-10-CM | POA: Diagnosis not present

## 2022-05-09 DIAGNOSIS — F411 Generalized anxiety disorder: Secondary | ICD-10-CM

## 2022-05-09 DIAGNOSIS — M4696 Unspecified inflammatory spondylopathy, lumbar region: Secondary | ICD-10-CM

## 2022-05-09 DIAGNOSIS — F419 Anxiety disorder, unspecified: Secondary | ICD-10-CM

## 2022-05-09 DIAGNOSIS — M48062 Spinal stenosis, lumbar region with neurogenic claudication: Secondary | ICD-10-CM

## 2022-05-09 DIAGNOSIS — G8929 Other chronic pain: Secondary | ICD-10-CM

## 2022-05-09 DIAGNOSIS — R351 Nocturia: Secondary | ICD-10-CM

## 2022-05-09 DIAGNOSIS — M539 Dorsopathy, unspecified: Secondary | ICD-10-CM

## 2022-05-09 DIAGNOSIS — K580 Irritable bowel syndrome with diarrhea: Secondary | ICD-10-CM

## 2022-05-09 DIAGNOSIS — Z884 Allergy status to anesthetic agent status: Secondary | ICD-10-CM | POA: Diagnosis not present

## 2022-05-09 DIAGNOSIS — Z7984 Long term (current) use of oral hypoglycemic drugs: Secondary | ICD-10-CM | POA: Diagnosis not present

## 2022-05-09 DIAGNOSIS — Z23 Encounter for immunization: Secondary | ICD-10-CM | POA: Diagnosis not present

## 2022-05-09 DIAGNOSIS — I252 Old myocardial infarction: Secondary | ICD-10-CM | POA: Diagnosis not present

## 2022-05-09 DIAGNOSIS — Z881 Allergy status to other antibiotic agents status: Secondary | ICD-10-CM | POA: Diagnosis not present

## 2022-05-09 DIAGNOSIS — Z0289 Encounter for other administrative examinations: Secondary | ICD-10-CM

## 2022-05-09 DIAGNOSIS — E1159 Type 2 diabetes mellitus with other circulatory complications: Secondary | ICD-10-CM

## 2022-05-09 DIAGNOSIS — E119 Type 2 diabetes mellitus without complications: Secondary | ICD-10-CM | POA: Diagnosis not present

## 2022-05-09 MED ORDER — LISINOPRIL 10 MG PO TABS: 10.0000 mg | ORAL_TABLET | Freq: Every day | ORAL | 0 refills | Status: DC

## 2022-05-09 MED ORDER — DULOXETINE HCL 60 MG PO CPEP: 60.0000 mg | ORAL_CAPSULE | Freq: Two times a day (BID) | ORAL | 0 refills | Status: DC

## 2022-05-09 MED ORDER — HYDROCHLOROTHIAZIDE 12.5 MG PO TABS: 12.5000 mg | ORAL_TABLET | Freq: Every day | ORAL | 0 refills | Status: DC

## 2022-05-09 MED ORDER — ATORVASTATIN CALCIUM 10 MG PO TABS: 10.0000 mg | ORAL_TABLET | Freq: Every day | ORAL | 0 refills | Status: DC

## 2022-05-09 MED ORDER — GLIPIZIDE ER 5 MG PO TB24: 5.0000 mg | ORAL_TABLET | Freq: Every day | ORAL | 0 refills | Status: DC

## 2022-05-09 MED ORDER — TAMSULOSIN HCL 0.4 MG PO CAPS: 0.4000 mg | ORAL_CAPSULE | Freq: Every day | ORAL | 0 refills | Status: DC

## 2022-05-09 MED ORDER — DICYCLOMINE HCL 10 MG PO CAPS: ORAL_CAPSULE | ORAL | 0 refills | Status: DC

## 2022-05-09 MED ORDER — GABAPENTIN 600 MG PO TABS: 600.0000 mg | ORAL_TABLET | Freq: Three times a day (TID) | ORAL | 0 refills | Status: DC

## 2022-05-09 MED ORDER — ATENOLOL 25 MG PO TABS: 25.0000 mg | ORAL_TABLET | Freq: Every day | ORAL | 0 refills | Status: DC
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
## 2022-05-14 ENCOUNTER — Encounter: Payer: Self-pay | Admitting: Physician Assistant

## 2022-05-14 MED ORDER — TRAMADOL HCL 50 MG PO TABS: ORAL_TABLET | ORAL | 0 refills | Status: DC

## 2022-05-14 MED ORDER — CLONAZEPAM 1 MG PO TABS: ORAL_TABLET | ORAL | 0 refills | Status: DC

## 2022-05-14 NOTE — Addendum Note (Signed)
Addended byAnnamaria Helling on: 05/14/2022 03:13 PM   Modules accepted: Orders
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
## 2022-05-14 NOTE — Telephone Encounter (Signed)
[name] called again about getting [facility] controlled medications.

## 2022-05-14 NOTE — Addendum Note (Signed)
Addended by: Donella Stade on: 05/14/2022 03:46 PM   Modules accepted: Orders

## 2022-05-16 ENCOUNTER — Telehealth: Payer: Self-pay | Admitting: Neurology

## 2022-05-16 NOTE — Telephone Encounter (Signed)
Patient's family called stating we have to call and give the okay for them to pick up clonazepam. Called the pharmacy and gave okay.

## 2022-05-31 ENCOUNTER — Other Ambulatory Visit: Payer: Self-pay | Admitting: Physician Assistant

## 2022-05-31 DIAGNOSIS — G629 Polyneuropathy, unspecified: Secondary | ICD-10-CM

## 2022-05-31 DIAGNOSIS — M48062 Spinal stenosis, lumbar region with neurogenic claudication: Secondary | ICD-10-CM

## 2022-05-31 DIAGNOSIS — Z0289 Encounter for other administrative examinations: Secondary | ICD-10-CM

## 2022-05-31 DIAGNOSIS — M539 Dorsopathy, unspecified: Secondary | ICD-10-CM

## 2022-05-31 DIAGNOSIS — G8929 Other chronic pain: Secondary | ICD-10-CM

## 2022-05-31 DIAGNOSIS — M4696 Unspecified inflammatory spondylopathy, lumbar region: Secondary | ICD-10-CM

## 2022-06-10 ENCOUNTER — Other Ambulatory Visit: Payer: Self-pay | Admitting: Physician Assistant

## 2022-06-10 DIAGNOSIS — E11649 Type 2 diabetes mellitus with hypoglycemia without coma: Secondary | ICD-10-CM

## 2022-06-10 DIAGNOSIS — E1142 Type 2 diabetes mellitus with diabetic polyneuropathy: Secondary | ICD-10-CM

## 2022-06-13 NOTE — Telephone Encounter (Signed)
New rxs for DM care

## 2022-06-16 ENCOUNTER — Other Ambulatory Visit: Payer: Self-pay | Admitting: Neurology

## 2022-06-16 DIAGNOSIS — F411 Generalized anxiety disorder: Secondary | ICD-10-CM

## 2022-06-16 NOTE — Telephone Encounter (Signed)
Patient's partner asked to have sent to [place] where they now live

## 2022-06-16 NOTE — Telephone Encounter (Signed)
Last written 05/14/2022 #30 no refills Last appt 04/29/2022

## 2022-06-18 MED ORDER — CLONAZEPAM 1 MG PO TABS: ORAL_TABLET | ORAL | 0 refills | Status: DC

## 2022-06-24 ENCOUNTER — Telehealth: Payer: Self-pay | Admitting: Physician Assistant

## 2022-06-24 NOTE — Telephone Encounter (Signed)
Called patient to schedule Medicare Annual Wellness Visit (AWV). Unable to reach patient.  Last date of AWV: Never  Please schedule an appointment at any time with Nurse Health Advisor.  If any questions, please contact me at [phone].  Thank you ,  Lin Givens Patient Access Advocate II Direct Dial: [phone]

## 2022-07-07 ENCOUNTER — Other Ambulatory Visit: Payer: Self-pay | Admitting: Physician Assistant

## 2022-07-07 DIAGNOSIS — G629 Polyneuropathy, unspecified: Secondary | ICD-10-CM

## 2022-07-07 DIAGNOSIS — I700 Atherosclerosis of aorta: Secondary | ICD-10-CM

## 2022-07-08 ENCOUNTER — Other Ambulatory Visit: Payer: Self-pay | Admitting: Physician Assistant

## 2022-07-08 DIAGNOSIS — I499 Cardiac arrhythmia, unspecified: Secondary | ICD-10-CM

## 2022-07-08 DIAGNOSIS — E1159 Type 2 diabetes mellitus with other circulatory complications: Secondary | ICD-10-CM

## 2022-07-08 DIAGNOSIS — M170 Bilateral primary osteoarthritis of knee: Secondary | ICD-10-CM

## 2022-07-08 DIAGNOSIS — G8929 Other chronic pain: Secondary | ICD-10-CM

## 2022-07-08 NOTE — Telephone Encounter (Signed)
Received vm from [employer] requesting refills for patient. Last written 05/14/2022 #180.  Last appt 04/29/2022

## 2022-07-09 MED ORDER — TRAMADOL HCL 50 MG PO TABS: ORAL_TABLET | ORAL | 0 refills | Status: DC

## 2022-07-09 NOTE — Telephone Encounter (Signed)
Last refill of tramadol 06/02/2022 .Marland KitchenPDMP reviewed during this encounter. Last appt 04/29/2022  Needs appt Sent refill for one month

## 2022-07-16 ENCOUNTER — Other Ambulatory Visit: Payer: Self-pay

## 2022-07-16 DIAGNOSIS — F411 Generalized anxiety disorder: Secondary | ICD-10-CM

## 2022-07-16 MED ORDER — CLONAZEPAM 1 MG PO TABS: ORAL_TABLET | ORAL | 0 refills | Status: DC

## 2022-07-16 NOTE — Telephone Encounter (Signed)
..  PDMP reviewed during this encounter. Last filled 06/18/2022 No more than 30 tablets in 30 day time period Sent refill.  Needs 3 month appt.

## 2022-07-16 NOTE — Telephone Encounter (Signed)
Last fill 06/18/2022 last visit 04/29/22

## 2022-07-29 ENCOUNTER — Ambulatory Visit: Payer: Medicare Other | Admitting: Physician Assistant

## 2022-08-12 ENCOUNTER — Other Ambulatory Visit: Payer: Self-pay

## 2022-08-12 DIAGNOSIS — I499 Cardiac arrhythmia, unspecified: Secondary | ICD-10-CM

## 2022-08-12 DIAGNOSIS — G8929 Other chronic pain: Secondary | ICD-10-CM

## 2022-08-12 DIAGNOSIS — M170 Bilateral primary osteoarthritis of knee: Secondary | ICD-10-CM

## 2022-08-12 DIAGNOSIS — F411 Generalized anxiety disorder: Secondary | ICD-10-CM

## 2022-08-12 NOTE — Telephone Encounter (Signed)
Needs 3 month appt

## 2022-08-13 ENCOUNTER — Encounter: Payer: Self-pay | Admitting: Physician Assistant

## 2022-08-13 ENCOUNTER — Ambulatory Visit (INDEPENDENT_AMBULATORY_CARE_PROVIDER_SITE_OTHER): Payer: Medicare Other | Admitting: Physician Assistant

## 2022-08-13 VITALS — BP 124/74 | HR 84 | Ht 64.0 in | Wt 165.0 lb

## 2022-08-13 DIAGNOSIS — I152 Hypertension secondary to endocrine disorders: Secondary | ICD-10-CM

## 2022-08-13 DIAGNOSIS — M25562 Pain in left knee: Secondary | ICD-10-CM

## 2022-08-13 DIAGNOSIS — I499 Cardiac arrhythmia, unspecified: Secondary | ICD-10-CM | POA: Diagnosis not present

## 2022-08-13 DIAGNOSIS — R351 Nocturia: Secondary | ICD-10-CM | POA: Diagnosis not present

## 2022-08-13 DIAGNOSIS — G8929 Other chronic pain: Secondary | ICD-10-CM | POA: Diagnosis not present

## 2022-08-13 DIAGNOSIS — M25561 Pain in right knee: Secondary | ICD-10-CM

## 2022-08-13 DIAGNOSIS — E1142 Type 2 diabetes mellitus with diabetic polyneuropathy: Secondary | ICD-10-CM

## 2022-08-13 DIAGNOSIS — E1159 Type 2 diabetes mellitus with other circulatory complications: Secondary | ICD-10-CM | POA: Diagnosis not present

## 2022-08-13 DIAGNOSIS — F411 Generalized anxiety disorder: Secondary | ICD-10-CM

## 2022-08-13 DIAGNOSIS — M170 Bilateral primary osteoarthritis of knee: Secondary | ICD-10-CM

## 2022-08-13 LAB — POCT GLYCOSYLATED HEMOGLOBIN (HGB A1C): Hemoglobin A1C: 6 % — AB (ref 4.0–5.6)

## 2022-08-13 MED ORDER — TRAMADOL HCL 50 MG PO TABS: ORAL_TABLET | ORAL | 0 refills | Status: DC

## 2022-08-13 MED ORDER — GLIPIZIDE ER 5 MG PO TB24
5.0000 mg | ORAL_TABLET | Freq: Two times a day (BID) | ORAL | 0 refills | Status: DC
Start: 2022-08-13 — End: 2022-11-12

## 2022-08-13 MED ORDER — DICLOFENAC SODIUM 1 % EX GEL
4.0000 g | Freq: Four times a day (QID) | CUTANEOUS | 1 refills | Status: DC
Start: 2022-08-13 — End: 2022-11-12

## 2022-08-13 MED ORDER — HYDROCHLOROTHIAZIDE 12.5 MG PO TABS: 12.5000 mg | ORAL_TABLET | Freq: Every day | ORAL | 1 refills | Status: DC

## 2022-08-13 MED ORDER — TAMSULOSIN HCL 0.4 MG PO CAPS: 0.4000 mg | ORAL_CAPSULE | Freq: Every day | ORAL | 3 refills | Status: DC

## 2022-08-13 MED ORDER — CLONAZEPAM 1 MG PO TABS: ORAL_TABLET | ORAL | 0 refills | Status: DC

## 2022-08-13 NOTE — Progress Notes (Signed)
Established Patient Office Visit  Subjective   Patient ID: Benjamin Ramirez, male    DOB: 30-Aug-1934  Age: 88 y.o. MRN: [id number]  Chief Complaint  Patient presents with   Follow-up    HPI Pt is a 88 yo male who presents to the clinic with his caregiver for medication refills. Pt has T2DM, Chronic pain due to DDD, IBS-D, and anxiety.   Klonapin refilled 3/13.  Tramadol last filled 3/6.   Pt is taking glilpizide and metformin. Pt is checking sugars in am and throughout the day. Caregiver has noticed some increase in glucose and ranging in the 180s in the evenings. No CP, SOB, or leg swelling.   Chronic pain and uses tramadol.  Continues to have anxiety, klonapin as needed.    Patient Active Problem List   Diagnosis Date Noted   Primary osteoarthritis 04/29/2022   Irritable bowel syndrome with diarrhea 04/29/2022   Regularly irregular pulse rhythym 03/27/2021   Bilateral lower extremity edema 03/27/2021   Dizziness 03/27/2021   GAD (generalized anxiety disorder) 06/12/2020   Uncontrolled type 2 diabetes mellitus with hyperglycemia 04/02/2020   Sinus arrhythmia seen on electrocardiogram 01/05/2020   Irregular heart rhythm 01/03/2020   Primary osteoarthritis of both knees 06/27/2019   CKD (chronic kidney disease) stage 3, GFR 30-59 ml/min 06/22/2019   Hyperlipidemia LDL goal <70 06/22/2019   Chronic pain of both knees 06/22/2019   Anxiety 06/22/2019   Multilevel degenerative disc disease 06/22/2019   Controlled type 2 diabetes mellitus with diabetic polyneuropathy, without long-term current use of insulin 06/22/2019   Elevated serum creatinine 02/16/2018   Mild mitral regurgitation 06/11/2017   Diet-controlled diabetes mellitus 06/05/2017   Hearing difficulty of both ears 06/05/2017   Peripheral polyneuropathy 06/05/2017   Aortic atherosclerosis 06/05/2017   Venous stasis dermatitis of both lower extremities 06/05/2017   Muscle atrophy of lower extremity 06/05/2017    Onychomycosis due to dermatophyte 06/05/2017   History of echocardiogram 06/05/2017   Cardiomegaly 06/05/2017   Pain management contract signed 06/05/2017   Umbilical hernia without obstruction or gangrene    Fatty liver    Spinal stenosis 05/29/2017   Hypertension associated with diabetes 05/29/2017   Insomnia secondary to chronic pain 05/29/2017   Encounter for chronic pain management 05/29/2017   Neurogenic claudication due to lumbar spinal stenosis 05/29/2017   Therapeutic opioid induced constipation 05/29/2017   History of myocardial infarction    Chronic sinusitis 10/06/2011   Erectile dysfunction 10/06/2011   GERD (gastroesophageal reflux disease) 10/06/2011   Hiatal hernia 10/06/2011   Seasonal allergies 10/06/2011   Spondylitis 10/06/2011   S/P ERCP 09/22/2011   Past Medical History:  Diagnosis Date   Allergy    Atherosclerosis of aorta    CAD (coronary artery disease)    Cardiomegaly    Chronic back pain    Depression    Diabetes mellitus without complication    Erectile dysfunction 10/06/2011   Fatty liver    GERD (gastroesophageal reflux disease)    Hiatal hernia 10/06/2011   History of myocardial infarction    Hypertension    Mild mitral regurgitation 06/11/2017   Osteoarthritis 10/06/2011   Spinal stenosis    Umbilical hernia without obstruction or gangrene    small, mesenteric   Past Surgical History:  Procedure Laterality Date   CHOLECYSTECTOMY     COLONOSCOPY WITH ESOPHAGOGASTRODUODENOSCOPY (EGD)     ERCP     KNEE CARTILAGE SURGERY Bilateral    History reviewed. No pertinent family history. Allergies  Allergen Reactions   Lyrica [Pregabalin] Other (See Comments)    "made me a zombie"   Cephalexin Nausea And Vomiting   Clarithromycin Nausea And Vomiting   Novocain [Procaine] Itching      ROS See HPI.    Objective:     BP 124/74   Pulse 84   Ht 5\' 4"  (1.626 m)   Wt 165 lb (74.8 kg)   SpO2 98%   BMI 28.32 kg/m  BP Readings from Last 3  Encounters:  08/13/22 124/74  04/29/22 (!) 135/57  11/20/21 (!) 99/39   Wt Readings from Last 3 Encounters:  08/13/22 165 lb (74.8 kg)  04/29/22 158 lb (71.7 kg)  11/20/21 163 lb (73.9 kg)    .Marland Kitchen Lab Results  Component Value Date   HGBA1C 6.0 (A) 08/13/2022     Physical Exam Constitutional:      Appearance: Normal appearance.  HENT:     Head: Normocephalic.  Cardiovascular:     Rate and Rhythm: Normal rate. Rhythm irregular.     Pulses: Normal pulses.     Heart sounds: Murmur heard.  Pulmonary:     Effort: Pulmonary effort is normal.  Musculoskeletal:     Right lower leg: No edema.     Left lower leg: No edema.  Neurological:     General: No focal deficit present.     Mental Status: He is alert and oriented to person, place, and time.  Psychiatric:        Mood and Affect: Mood normal.            Assessment & Plan:  Marland KitchenMarland KitchenLeandrew was seen today for follow-up.  Diagnoses and all orders for this visit:  Controlled type 2 diabetes mellitus with diabetic polyneuropathy, without long-term current use of insulin -     glipiZIDE (GLUCOTROL XL) 5 MG 24 hr tablet; Take 1 tablet (5 mg total) by mouth 2 (two) times daily. -     COMPLETE METABOLIC PANEL WITH GFR -     Ambulatory referral to Ophthalmology -     POCT HgB A1C  Hypertension associated with diabetes -     hydrochlorothiazide (HYDRODIURIL) 12.5 MG tablet; Take 1 tablet (12.5 mg total) by mouth daily. -     Ambulatory referral to Ophthalmology  Regularly irregular pulse rhythym -     hydrochlorothiazide (HYDRODIURIL) 12.5 MG tablet; Take 1 tablet (12.5 mg total) by mouth daily. -     traMADol (ULTRAM) 50 MG tablet; TAKE 1 TO 2 TABLETS(50 TO 100 MG) BY MOUTH EVERY 8 HOURS AS NEEDED FOR MODERATE PAIN. MAXIMUM 6 TABLETS DAILY -     Ambulatory referral to Ophthalmology  Nocturia -     tamsulosin (FLOMAX) 0.4 MG CAPS capsule; Take 1 capsule (0.4 mg total) by mouth daily. -     Ambulatory referral to  Ophthalmology  Primary osteoarthritis of both knees -     traMADol (ULTRAM) 50 MG tablet; TAKE 1 TO 2 TABLETS(50 TO 100 MG) BY MOUTH EVERY 8 HOURS AS NEEDED FOR MODERATE PAIN. MAXIMUM 6 TABLETS DAILY -     Ambulatory referral to Ophthalmology  Encounter for chronic pain management -     traMADol (ULTRAM) 50 MG tablet; TAKE 1 TO 2 TABLETS(50 TO 100 MG) BY MOUTH EVERY 8 HOURS AS NEEDED FOR MODERATE PAIN. MAXIMUM 6 TABLETS DAILY -     Ambulatory referral to Ophthalmology  Chronic pain of both knees -     traMADol (ULTRAM) 50 MG tablet; TAKE 1  TO 2 TABLETS(50 TO 100 MG) BY MOUTH EVERY 8 HOURS AS NEEDED FOR MODERATE PAIN. MAXIMUM 6 TABLETS DAILY -     Ambulatory referral to Ophthalmology -     diclofenac Sodium (VOLTAREN) 1 % GEL; Apply 4 g topically 4 (four) times daily. To affected joint.  GAD (generalized anxiety disorder) -     Discontinue: clonazePAM (KLONOPIN) 1 MG tablet; Take one-half tablet twice a day. -     Ambulatory referral to Ophthalmology   A1C to goal  Continue same medications BP to goal On statin Referral made to eye doctor Foot exam UTD Covid and pneumonia UTD Needs shingles-get at pharmacy  Pain contract UTD .Marland KitchenPDMP reviewed during this encounter. Tramadol refilled Follow up in 3 months  Discussed anxiety No more than once a day klonapin use and NOT with tramadol   Needs medicare wellness exam    Tandy Gaw, PA-C

## 2022-08-14 ENCOUNTER — Telehealth: Payer: Self-pay | Admitting: Physician Assistant

## 2022-08-14 ENCOUNTER — Other Ambulatory Visit: Payer: Self-pay

## 2022-08-14 DIAGNOSIS — F411 Generalized anxiety disorder: Secondary | ICD-10-CM

## 2022-08-14 LAB — COMPLETE METABOLIC PANEL WITH GFR
AG Ratio: 1.5 (calc) (ref 1.0–2.5)
ALT: 12 U/L (ref 9–46)
AST: 16 U/L (ref 10–35)
Albumin: 4.3 g/dL (ref 3.6–5.1)
Alkaline phosphatase (APISO): 61 U/L (ref 35–144)
BUN/Creatinine Ratio: 18 (calc) (ref 6–22)
BUN: 27 mg/dL — ABNORMAL HIGH (ref 7–25)
CO2: 29 mmol/L (ref 20–32)
Calcium: 10 mg/dL (ref 8.6–10.3)
Chloride: 106 mmol/L (ref 98–110)
Creat: 1.51 mg/dL — ABNORMAL HIGH (ref 0.70–1.22)
Globulin: 2.9 g/dL (calc) (ref 1.9–3.7)
Glucose, Bld: 113 mg/dL — ABNORMAL HIGH (ref 65–99)
Potassium: 5.6 mmol/L — ABNORMAL HIGH (ref 3.5–5.3)
Sodium: 143 mmol/L (ref 135–146)
Total Bilirubin: 0.5 mg/dL (ref 0.2–1.2)
Total Protein: 7.2 g/dL (ref 6.1–8.1)
eGFR: 45 mL/min/{1.73_m2} — ABNORMAL LOW (ref >=60)

## 2022-08-14 MED ORDER — CLONAZEPAM 1 MG PO TABS: ORAL_TABLET | ORAL | 0 refills | Status: DC

## 2022-08-14 NOTE — Telephone Encounter (Signed)
Pt is requesting to have his prescriptions  glipiZIDE (GLUCOTROL XL) 5 MG 24 hr tablet [[id number] and hydrochlorothiazide (HYDRODIURIL) 12.5 MG tablet [[id number]] and diclofenac Sodium (VOLTAREN) 1 % GEL [[id number]] and  tamsulosin (FLOMAX) 0.4 MG CAPS capsule [[id number]]  to be sent to [id number] - [place], [place] - [address] AT [facility] OF [address]

## 2022-08-14 NOTE — Telephone Encounter (Signed)
[name] states Clonazepam is on backorder with [facility]. He is completely out for the next 13 days and needs some today. She states she would like it sent to [facility] pharmacy.

## 2022-08-14 NOTE — Progress Notes (Signed)
Benjamin Ramirez,   Kidney are dry. Make sure drinking enough water. Kidney function is down some.  Potassium is up some. Recheck Monday.

## 2022-08-15 ENCOUNTER — Other Ambulatory Visit: Payer: Self-pay

## 2022-08-15 DIAGNOSIS — R799 Abnormal finding of blood chemistry, unspecified: Secondary | ICD-10-CM

## 2022-08-18 ENCOUNTER — Encounter: Payer: Self-pay | Admitting: Physician Assistant

## 2022-08-18 ENCOUNTER — Other Ambulatory Visit: Payer: Self-pay | Admitting: Physician Assistant

## 2022-08-18 DIAGNOSIS — R351 Nocturia: Secondary | ICD-10-CM

## 2022-08-18 NOTE — Telephone Encounter (Signed)
All things were sent at appt.

## 2022-08-28 ENCOUNTER — Telehealth: Payer: Self-pay | Admitting: Pharmacist

## 2022-08-28 NOTE — Progress Notes (Signed)
Patient appearing on report for quality metrics.  Outreached patient to discuss medication management. Left voicemail for patient to return my call at their convenience.   Deborah Dondero, PharmD, BCPS Clinical Pharmacist [facility]  

## 2022-09-11 ENCOUNTER — Other Ambulatory Visit: Payer: Self-pay | Admitting: Physician Assistant

## 2022-09-11 DIAGNOSIS — F411 Generalized anxiety disorder: Secondary | ICD-10-CM

## 2022-09-11 NOTE — Telephone Encounter (Signed)
Caregiver called to request a refill on clonazepam 1mg  please submit to [facility] on [address] [place]

## 2022-09-12 MED ORDER — CLONAZEPAM 1 MG PO TABS: ORAL_TABLET | ORAL | 2 refills | Status: DC

## 2022-09-12 NOTE — Telephone Encounter (Signed)
..  PDMP reviewed during this encounter. Reviewed to be refilled on 5/11 Sent refill

## 2022-09-15 ENCOUNTER — Telehealth: Payer: Self-pay | Admitting: Physician Assistant

## 2022-09-15 DIAGNOSIS — I499 Cardiac arrhythmia, unspecified: Secondary | ICD-10-CM

## 2022-09-15 DIAGNOSIS — G8929 Other chronic pain: Secondary | ICD-10-CM

## 2022-09-15 DIAGNOSIS — M170 Bilateral primary osteoarthritis of knee: Secondary | ICD-10-CM

## 2022-09-15 NOTE — Telephone Encounter (Signed)
Ms. Benjamin Ramirez called. Pharmacy needs a new rx for his Tramadol.

## 2022-09-16 ENCOUNTER — Other Ambulatory Visit: Payer: Self-pay

## 2022-09-16 DIAGNOSIS — G8929 Other chronic pain: Secondary | ICD-10-CM

## 2022-09-16 DIAGNOSIS — I499 Cardiac arrhythmia, unspecified: Secondary | ICD-10-CM

## 2022-09-16 DIAGNOSIS — M170 Bilateral primary osteoarthritis of knee: Secondary | ICD-10-CM

## 2022-09-16 MED ORDER — TRAMADOL HCL 50 MG PO TABS: ORAL_TABLET | ORAL | 0 refills | Status: DC

## 2022-09-16 MED ORDER — TRAMADOL HCL 50 MG PO TABS
ORAL_TABLET | ORAL | 0 refills | Status: DC
Start: 2022-09-16 — End: 2022-11-12

## 2022-09-16 NOTE — Telephone Encounter (Signed)
Received incoming voicemail from [facility] requesting new Rx for patients Tramadol 50 mg to be called into [facility] on [address] in [place], [place]. Ms. Ria Bush states that patient is completely out of medication and he feels as though his back is" breaking." Please advise.

## 2022-09-16 NOTE — Telephone Encounter (Signed)
..  PDMP reviewed during this encounter. Refilled tramadol.  

## 2022-09-17 NOTE — Telephone Encounter (Signed)
Ms. Benjamin Ramirez has been notified of Rx status.

## 2022-10-07 DIAGNOSIS — H2512 Age-related nuclear cataract, left eye: Secondary | ICD-10-CM | POA: Diagnosis not present

## 2022-10-10 ENCOUNTER — Telehealth: Payer: Self-pay | Admitting: Physician Assistant

## 2022-10-10 NOTE — Telephone Encounter (Signed)
Patient called requesting a refill of ; Klonopin 1mg   Pharmacy ; [facility]  [address]

## 2022-10-17 ENCOUNTER — Other Ambulatory Visit: Payer: Self-pay | Admitting: Physician Assistant

## 2022-10-17 DIAGNOSIS — G629 Polyneuropathy, unspecified: Secondary | ICD-10-CM

## 2022-10-17 DIAGNOSIS — M539 Dorsopathy, unspecified: Secondary | ICD-10-CM

## 2022-10-17 DIAGNOSIS — G8929 Other chronic pain: Secondary | ICD-10-CM

## 2022-10-17 DIAGNOSIS — M4696 Unspecified inflammatory spondylopathy, lumbar region: Secondary | ICD-10-CM

## 2022-10-17 DIAGNOSIS — M48062 Spinal stenosis, lumbar region with neurogenic claudication: Secondary | ICD-10-CM

## 2022-10-17 DIAGNOSIS — Z0289 Encounter for other administrative examinations: Secondary | ICD-10-CM

## 2022-10-28 ENCOUNTER — Other Ambulatory Visit: Payer: Self-pay | Admitting: Physician Assistant

## 2022-10-28 DIAGNOSIS — I499 Cardiac arrhythmia, unspecified: Secondary | ICD-10-CM

## 2022-10-28 DIAGNOSIS — E1159 Type 2 diabetes mellitus with other circulatory complications: Secondary | ICD-10-CM

## 2022-11-10 ENCOUNTER — Telehealth: Payer: Self-pay | Admitting: Physician Assistant

## 2022-11-10 NOTE — Telephone Encounter (Signed)
Significant other called.  She is requesting refill on Klonopin.  They have moved back to [place].   Please send script to [facility]

## 2022-11-11 NOTE — Telephone Encounter (Signed)
Patient's significant other called She is requesting refill on Klonopin 1mg  please send [facility] [phone]

## 2022-11-12 ENCOUNTER — Encounter: Payer: Self-pay | Admitting: Physician Assistant

## 2022-11-12 ENCOUNTER — Telehealth: Payer: Self-pay | Admitting: Physician Assistant

## 2022-11-12 ENCOUNTER — Ambulatory Visit (INDEPENDENT_AMBULATORY_CARE_PROVIDER_SITE_OTHER): Payer: Medicare Other | Admitting: Physician Assistant

## 2022-11-12 VITALS — BP 98/67 | HR 86 | Ht 64.0 in | Wt 164.8 lb

## 2022-11-12 DIAGNOSIS — M25562 Pain in left knee: Secondary | ICD-10-CM

## 2022-11-12 DIAGNOSIS — G8929 Other chronic pain: Secondary | ICD-10-CM

## 2022-11-12 DIAGNOSIS — F411 Generalized anxiety disorder: Secondary | ICD-10-CM | POA: Diagnosis not present

## 2022-11-12 DIAGNOSIS — E785 Hyperlipidemia, unspecified: Secondary | ICD-10-CM

## 2022-11-12 DIAGNOSIS — E1142 Type 2 diabetes mellitus with diabetic polyneuropathy: Secondary | ICD-10-CM

## 2022-11-12 DIAGNOSIS — I499 Cardiac arrhythmia, unspecified: Secondary | ICD-10-CM

## 2022-11-12 DIAGNOSIS — Z23 Encounter for immunization: Secondary | ICD-10-CM | POA: Diagnosis not present

## 2022-11-12 DIAGNOSIS — Z7984 Long term (current) use of oral hypoglycemic drugs: Secondary | ICD-10-CM

## 2022-11-12 DIAGNOSIS — M25561 Pain in right knee: Secondary | ICD-10-CM

## 2022-11-12 LAB — POCT GLYCOSYLATED HEMOGLOBIN (HGB A1C): Hemoglobin A1C: 6.6 % — AB (ref 4.0–5.6)

## 2022-11-12 MED ORDER — CLONAZEPAM 1 MG PO TABS
ORAL_TABLET | ORAL | 2 refills | Status: DC
Start: 2022-11-12 — End: 2023-01-21

## 2022-11-12 MED ORDER — GLIPIZIDE 10 MG PO TABS: 10.0000 mg | ORAL_TABLET | Freq: Two times a day (BID) | ORAL | 2 refills | Status: DC

## 2022-11-12 MED ORDER — DICLOFENAC SODIUM 1 % EX GEL
4.0000 g | Freq: Four times a day (QID) | CUTANEOUS | 1 refills | Status: DC
Start: 2022-11-12 — End: 2023-05-11

## 2022-11-12 MED ORDER — ATENOLOL 25 MG PO TABS
25.0000 mg | ORAL_TABLET | Freq: Every day | ORAL | 1 refills | Status: DC
Start: 2022-11-12 — End: 2023-03-30

## 2022-11-12 MED ORDER — TRAMADOL HCL 50 MG PO TABS
ORAL_TABLET | ORAL | 0 refills | Status: DC
Start: 2022-11-14 — End: 2022-12-26

## 2022-11-12 MED ORDER — ATENOLOL 25 MG PO TABS
25.0000 mg | ORAL_TABLET | Freq: Every day | ORAL | 1 refills | Status: DC
Start: 2022-11-12 — End: 2022-11-12

## 2022-11-12 MED ORDER — LISINOPRIL 5 MG PO TABS
5.0000 mg | ORAL_TABLET | Freq: Every day | ORAL | 1 refills | Status: DC
Start: 2022-11-12 — End: 2022-12-04

## 2022-11-12 MED ORDER — GLIPIZIDE 10 MG PO TABS
10.0000 mg | ORAL_TABLET | Freq: Two times a day (BID) | ORAL | 2 refills | Status: AC
Start: 2022-11-12 — End: ?

## 2022-11-12 MED ORDER — LISINOPRIL 5 MG PO TABS: 5.0000 mg | ORAL_TABLET | Freq: Every day | ORAL | 1 refills | Status: DC

## 2022-11-12 MED ORDER — DICLOFENAC SODIUM 1 % EX GEL
4.0000 g | Freq: Four times a day (QID) | CUTANEOUS | 1 refills | Status: DC
Start: 2022-11-12 — End: 2022-11-12

## 2022-11-12 NOTE — Telephone Encounter (Signed)
Patient's prescriptions  Atenolol  25mg  Diclofenac Sodium 1 % gel, Glipzide 10mg  tab Lisinopril 5mg  Please resubmit to [facility] on [address] [place]  Phone [phone]  Patient stated they went to wrong pharmacy  [facility]

## 2022-11-12 NOTE — Patient Instructions (Addendum)
Lisinopril decreased to 5mg  Increased glipizide to 10mg  twice a day Voltaren gel refilled

## 2022-11-13 ENCOUNTER — Encounter: Payer: Self-pay | Admitting: Physician Assistant

## 2022-11-13 NOTE — Progress Notes (Signed)
Established Patient Office Visit  Subjective   Patient ID: Benjamin Ramirez, male    DOB: [birth year]  Age: 88 y.o. MRN: [id number]  Chief Complaint  Patient presents with   Medical Management of Chronic Issues    Pt has concerns about ankle swelling. Also significant other states pt has been using another family member's metformin due to sugars being high.    HPI Pt is a 88 yo male with T2DM, HTN, hx of MI, chronic pain of knees and low back due to arthritis, GERD, anxiety who presents to the clinic for follow up.   Pt is checking his sugars and after eating are jumping into the 200s. He has been taking a friends metformin and that will bring it back down. No open sores or wounds. Pt is not very active due to pain. He does not keep a diabetic diet. He reports some dizziness when standing. He occasionally will swell in his ankles. No significant SOB. Pain is controlled with tramadol and voltaren gel. Anxiety with as needed klonapin.   He needs refills today.   .. Active Ambulatory Problems    Diagnosis Date Noted   Chronic sinusitis 10/06/2011   Erectile dysfunction 10/06/2011   GERD (gastroesophageal reflux disease) 10/06/2011   Hiatal hernia 10/06/2011   S/P ERCP 09/22/2011   Seasonal allergies 10/06/2011   Spinal stenosis 05/29/2017   Spondylitis (HCC) 10/06/2011   History of myocardial infarction    Hypertension associated with diabetes (HCC) 05/29/2017   Insomnia secondary to chronic pain 05/29/2017   Encounter for chronic pain management 05/29/2017   Neurogenic claudication due to lumbar spinal stenosis 05/29/2017   Therapeutic opioid induced constipation 05/29/2017   Diet-controlled diabetes mellitus (HCC) 06/05/2017   Umbilical hernia without obstruction or gangrene    Fatty liver    Hearing difficulty of both ears 06/05/2017   Peripheral polyneuropathy 06/05/2017   Aortic atherosclerosis (HCC) 06/05/2017   Venous stasis dermatitis of both lower extremities 06/05/2017    Muscle atrophy of lower extremity 06/05/2017   Onychomycosis due to dermatophyte 06/05/2017   History of echocardiogram 06/05/2017   Cardiomegaly 06/05/2017   Pain management contract signed 06/05/2017   Mild mitral regurgitation 06/11/2017   Elevated serum creatinine 02/16/2018   CKD (chronic kidney disease) stage 3, GFR 30-59 ml/min (HCC) 06/22/2019   Hyperlipidemia LDL goal <70 06/22/2019   Chronic pain of both knees 06/22/2019   Anxiety 06/22/2019   Multilevel degenerative disc disease 06/22/2019   Controlled type 2 diabetes mellitus with diabetic polyneuropathy, without long-term current use of insulin (HCC) 06/22/2019   Primary osteoarthritis of both knees 06/27/2019   Irregular heart rhythm 01/03/2020   Sinus arrhythmia seen on electrocardiogram 01/05/2020   Uncontrolled type 2 diabetes mellitus with hyperglycemia (HCC) 04/02/2020   GAD (generalized anxiety disorder) 06/12/2020   Regularly irregular pulse rhythym 03/27/2021   Bilateral lower extremity edema 03/27/2021   Dizziness 03/27/2021   Primary osteoarthritis 04/29/2022   Irritable bowel syndrome with diarrhea 04/29/2022   Resolved Ambulatory Problems    Diagnosis Date Noted   Diabetes mellitus with neurological manifestations, controlled (HCC) 07/26/2014   Myocardial infarction (HCC) 10/06/2011   Osteoarthritis 10/06/2011   Acute cystitis with hematuria 02/16/2018   Past Medical History:  Diagnosis Date   Allergy    Atherosclerosis of aorta (HCC)    CAD (coronary artery disease)    Chronic back pain    Depression    Diabetes mellitus without complication (HCC)    Hypertension  ROS See HPI.    Objective:     BP 98/67 (BP Location: Right Arm, Patient Position: Sitting, Cuff Size: Normal)   Pulse 86   Ht 5\' 4"  (1.626 m)   Wt 164 lb 12 oz (74.7 kg)   SpO2 96%   BMI 28.28 kg/m  BP Readings from Last 3 Encounters:  11/12/22 98/67  08/13/22 124/74  04/29/22 (!) 135/57   Wt Readings from Last 3  Encounters:  11/12/22 164 lb 12 oz (74.7 kg)  08/13/22 165 lb (74.8 kg)  04/29/22 158 lb (71.7 kg)      Physical Exam Constitutional:      Appearance: Normal appearance.  HENT:     Head: Normocephalic.  Cardiovascular:     Rate and Rhythm: Normal rate. Rhythm irregular.  Pulmonary:     Effort: Pulmonary effort is normal.  Musculoskeletal:     Right lower leg: No edema.     Left lower leg: No edema.  Neurological:     General: No focal deficit present.     Mental Status: He is alert and oriented to person, place, and time.  Psychiatric:        Mood and Affect: Mood normal.      Results for orders placed or performed in visit on 11/12/22  POCT HgB A1C  Result Value Ref Range   Hemoglobin A1C 6.6 (A) 4.0 - 5.6 %   HbA1c POC (<> result, manual entry)     HbA1c, POC (prediabetic range)     HbA1c, POC (controlled diabetic range)        Assessment & Plan:  Marland KitchenMarland KitchenPrashant was seen today for medical management of chronic issues.  Diagnoses and all orders for this visit:  Controlled type 2 diabetes mellitus with diabetic polyneuropathy, without long-term current use of insulin (HCC) -     POCT HgB A1C -     glipiZIDE (GLUCOTROL) 10 MG tablet; Take 1 tablet (10 mg total) by mouth 2 (two) times daily before a meal. -     lisinopril (ZESTRIL) 5 MG tablet; Take 1 tablet (5 mg total) by mouth daily.  Regularly irregular pulse rhythym -     Discontinue: atenolol (TENORMIN) 25 MG tablet; Take 1 tablet (25 mg total) by mouth daily. -     atenolol (TENORMIN) 25 MG tablet; Take 1 tablet (25 mg total) by mouth daily.  GAD (generalized anxiety disorder) -     clonazePAM (KLONOPIN) 1 MG tablet; Take one-half tablet twice a day.  Chronic pain of both knees -     Discontinue: diclofenac Sodium (VOLTAREN) 1 % GEL; Apply 4 g topically 4 (four) times daily. To affected joint. -     traMADol (ULTRAM) 50 MG tablet; Take 1-2 tablets every 8 hours as needed for moderate pain. Max 6 tablets  daily. -     diclofenac Sodium (VOLTAREN) 1 % GEL; Apply 4 g topically 4 (four) times daily. To affected joint.  Need for shingles vaccine -     Zoster, Recombinant (Shingrix)  Hyperlipidemia LDL goal <70  Other orders -     Discontinue: lisinopril (ZESTRIL) 5 MG tablet; Take 1 tablet (5 mg total) by mouth daily. -     Discontinue: glipiZIDE (GLUCOTROL) 10 MG tablet; Take 1 tablet (10 mg total) by mouth 2 (two) times daily before a meal.  A1C up from last check but under 7 I did increase glipizide to 10mg  bid Stop using friends metformin I do think family was checking sugars too  soon after eating and getting high readings Discussed to check 2 hours after eating On ACE and BP too low decreased down to 5mg  daily On statin .Marland Kitchen Diabetic Foot Exam - Simple   Simple Foot Form Visual Inspection No deformities, no ulcerations, no other skin breakdown bilaterally: Yes Sensation Testing See comments: Yes Pulse Check Posterior Tibialis and Dorsalis pulse intact bilaterally: Yes Comments Decrease sensation on balls of feet, bilaterally.     eye exam UTD Encouraged to get shingles vaccine at pharmacy Follow up in 3 months  Anxiety-refilled klonapin .Marland KitchenPDMP reviewed during this encounter. Tramadol was post dated for 12th as earliest refill for chronic pain Refilled voltaren gel to use as needed  Needs Medicare Wellness Exam, please schedule   Return in about 3 months (around 02/12/2023).    Tandy Gaw, PA-C

## 2022-11-28 ENCOUNTER — Telehealth: Payer: Self-pay | Admitting: Physician Assistant

## 2022-11-28 NOTE — Telephone Encounter (Signed)
Patient's wife called stating his blood pressure has been low lately.   Patient is will stop taking his prescription of lisinopril (ZESTRIL) 5 MG tablet [[id number]] . Every time he takes it his bp drops.  BP this morning 99/48.

## 2022-12-01 NOTE — Telephone Encounter (Signed)
Routing to pcp for advice.

## 2022-12-01 NOTE — Telephone Encounter (Signed)
Ok to stop for now and continue to check BP. Ok to take off medication list.

## 2022-12-04 NOTE — Addendum Note (Signed)
Addended by: Ernest Mallick A on: 12/04/2022 10:00 AM   Modules accepted: Orders

## 2022-12-05 ENCOUNTER — Ambulatory Visit: Payer: Medicare Other | Admitting: Physician Assistant

## 2022-12-08 ENCOUNTER — Telehealth: Payer: Self-pay | Admitting: Family Medicine

## 2022-12-08 NOTE — Telephone Encounter (Signed)
Patient's caregiver called in needed a refill for Klonopin 1mg . Please [place] [address]

## 2022-12-10 NOTE — Telephone Encounter (Signed)
This refill is NOT appropriate he has refills.

## 2022-12-12 ENCOUNTER — Telehealth: Payer: Self-pay | Admitting: Physician Assistant

## 2022-12-12 NOTE — Telephone Encounter (Signed)
Patient is going to pick up the prescription at the [facility].

## 2022-12-12 NOTE — Telephone Encounter (Signed)
Pt's sgo called in to request refill on  Klonopin. [facility] in [place] is Physiological [facility]. She stated that he is completely out.

## 2022-12-15 ENCOUNTER — Telehealth: Payer: Self-pay | Admitting: Physician Assistant

## 2022-12-15 NOTE — Telephone Encounter (Signed)
Patient's caregiver called in for Klonopin and tramadol refill. Stated that the [facility] pharmacy did not have the klonopin rx in the system. Informed that the Klonopin was sent to [facility].   Please advise on tramadol refill

## 2022-12-25 ENCOUNTER — Telehealth: Payer: Self-pay | Admitting: Physician Assistant

## 2022-12-25 DIAGNOSIS — G8929 Other chronic pain: Secondary | ICD-10-CM

## 2022-12-25 NOTE — Telephone Encounter (Signed)
Patient called he is requesting a refill on tramdol 50mg  Pharmacy [facility] Phone [phone]

## 2022-12-26 MED ORDER — TRAMADOL HCL 50 MG PO TABS
ORAL_TABLET | ORAL | 0 refills | Status: AC
Start: 2022-12-26 — End: ?

## 2022-12-26 NOTE — Telephone Encounter (Signed)
Sent to pharmacy 

## 2022-12-26 NOTE — Telephone Encounter (Signed)
..  PDMP reviewed during this encounter. UTD pain contract UTD drug screen Regular appts Sent tramadol.

## 2022-12-26 NOTE — Addendum Note (Signed)
Addended by: Jomarie Longs on: 12/26/2022 12:44 PM   Modules accepted: Orders

## 2023-01-04 ENCOUNTER — Other Ambulatory Visit: Payer: Self-pay | Admitting: Physician Assistant

## 2023-01-04 DIAGNOSIS — E1142 Type 2 diabetes mellitus with diabetic polyneuropathy: Secondary | ICD-10-CM

## 2023-01-08 ENCOUNTER — Other Ambulatory Visit: Payer: Self-pay | Admitting: Physician Assistant

## 2023-01-08 DIAGNOSIS — F411 Generalized anxiety disorder: Secondary | ICD-10-CM

## 2023-01-08 NOTE — Telephone Encounter (Signed)
Contacted Caregiver ( Sheryl) to reschedule upcoming appointment to 02/16/2023 at 3:00 pm with Tandy Gaw, PA. Caregiver also wants to remind [name] that Rx for Clonazepam 1 mg  needs to be called into [facility] pharmacy- [place] on [address]. Please advise.  CB: [phone]

## 2023-01-09 NOTE — Telephone Encounter (Signed)
Pt should have rx to be picked up 9/10.

## 2023-01-14 ENCOUNTER — Telehealth: Payer: Self-pay | Admitting: Physician Assistant

## 2023-01-14 NOTE — Telephone Encounter (Signed)
Patient called requesting a refill of ; clonazePAM (KLONOPIN) 1 MG tablet [[id number]]   Pharmacy ;   [id number] - [place], [place] - [address]

## 2023-01-15 NOTE — Telephone Encounter (Signed)
Pt called requesting an update on refill. Pt has been out of this medication for 2 days.

## 2023-01-16 NOTE — Telephone Encounter (Signed)
Can we call pharmacy? There should be a refill on it. Last rx sent 7/10 with 2 refills.

## 2023-01-20 ENCOUNTER — Other Ambulatory Visit: Payer: Self-pay | Admitting: Physician Assistant

## 2023-01-20 DIAGNOSIS — F411 Generalized anxiety disorder: Secondary | ICD-10-CM

## 2023-01-20 NOTE — Telephone Encounter (Signed)
Prescription Request  01/20/2023  LOV: 11/12/2022  What is the name of the medication or equipment? clonazePAM (KLONOPIN) 1 MG tablet  Have you contacted your pharmacy to request a refill? Yes   Which pharmacy would you like this sent to?   [facility][id number] - [place], [place] - [address] AT [address] [place] [address] Phone: [phone] Fax: [phone]   Patient notified that their request is being sent to the clinical staff for review and that they should receive a response within 2 business days.   Please advise at [facility] [phone]

## 2023-01-21 MED ORDER — CLONAZEPAM 1 MG PO TABS
ORAL_TABLET | ORAL | 2 refills | Status: DC
Start: 2023-01-21 — End: 2023-03-19

## 2023-01-21 NOTE — Telephone Encounter (Signed)
Requesting clonazepam 1mg   Last written 11/12/2022 Last OV 11/12/2022 Upcoming appt 02/16/2023

## 2023-01-21 NOTE — Telephone Encounter (Signed)
..  PDMP reviewed during this encounter. Ok for refill Last fill 12/12/2022

## 2023-01-31 ENCOUNTER — Other Ambulatory Visit: Payer: Self-pay | Admitting: Physician Assistant

## 2023-01-31 DIAGNOSIS — E1142 Type 2 diabetes mellitus with diabetic polyneuropathy: Secondary | ICD-10-CM

## 2023-02-02 ENCOUNTER — Telehealth: Payer: Self-pay | Admitting: Physician Assistant

## 2023-02-02 DIAGNOSIS — G8929 Other chronic pain: Secondary | ICD-10-CM

## 2023-02-02 NOTE — Telephone Encounter (Signed)
Patient called he is requesting a refill on Tramadol 50mg  please submit to [facility]  in [place] Phone [phone]

## 2023-02-03 MED ORDER — TRAMADOL HCL 50 MG PO TABS
ORAL_TABLET | ORAL | 0 refills | Status: DC
Start: 2023-02-03 — End: 2023-03-19

## 2023-02-03 NOTE — Telephone Encounter (Signed)
[id number]

## 2023-02-03 NOTE — Telephone Encounter (Signed)
I have not seen this patient since 2021

## 2023-02-13 ENCOUNTER — Ambulatory Visit: Payer: Medicare Other | Admitting: Physician Assistant

## 2023-02-16 ENCOUNTER — Ambulatory Visit: Payer: Medicare Other | Admitting: Physician Assistant

## 2023-02-24 ENCOUNTER — Ambulatory Visit: Payer: Medicare Other | Admitting: Physician Assistant

## 2023-02-24 DIAGNOSIS — Z0000 Encounter for general adult medical examination without abnormal findings: Secondary | ICD-10-CM | POA: Diagnosis not present

## 2023-02-24 NOTE — Patient Instructions (Addendum)
MEDICARE ANNUAL WELLNESS VISIT Health Maintenance Summary and Written Plan of Care  [name] ,  Thank you for allowing me to perform your Medicare Annual Wellness Visit and for your ongoing commitment to your health.   Health Maintenance & Immunization History Health Maintenance  Topic Date Due  . OPHTHALMOLOGY EXAM  05/11/2022  . FOOT EXAM  02/24/2023 (Originally 10/10/2022)  . Zoster Vaccines- Shingrix (1 of 2) 05/27/2023 (Originally 01/30/1954)  . INFLUENZA VACCINE  08/03/2023 (Originally 12/04/2022)  . HEMOGLOBIN A1C  05/15/2023  . Medicare Annual Wellness (AWV)  02/24/2024  . DTaP/Tdap/Td (3 - Td or Tdap) 10/10/2031  . Pneumonia Vaccine 60+ Years old  Completed  . HPV VACCINES  Aged Out  . COVID-19 Vaccine  Discontinued   Immunization History  Administered Date(s) Administered  . Fluad Quad(high Dose 65+) 03/26/2020, 01/22/2021, 04/29/2022  . Influenza, High Dose Seasonal PF 05/29/2017  . PNEUMOCOCCAL CONJUGATE-20 10/09/2021  . Pneumococcal Conjugate-13 01/03/2014  . Pneumococcal Polysaccharide-23 09/14/2012  . Td 09/30/2007  . Tdap 10/09/2021    These are the patient goals that we discussed:  Goals Addressed              This Visit's Progress   .  Patient Stated (pt-stated)        Patient stated that he would like to keep his health good.         This is a list of Health Maintenance Items that are overdue or due now: Shingles vaccine Influenza vaccine Foot exam.  Orders/Referrals Placed Today: No orders of the defined types were placed in this encounter.  (Contact our referral department at [phone] if you have not spoken with someone about your referral appointment within the next 5 days)    Follow-up Plan Follow-up with Tandy Gaw L, PA-C as planned Schedule influenza and shingles vaccine. Medicare wellness visit in one year.  AVS printed and mailed to the patient.       Health Maintenance, Male Adopting a healthy lifestyle and getting  preventive care are important in promoting health and wellness. Ask your health care provider about: The right schedule for you to have regular tests and exams. Things you can do on your own to prevent diseases and keep yourself healthy. What should I know about diet, weight, and exercise? Eat a healthy diet  Eat a diet that includes plenty of vegetables, fruits, low-fat dairy products, and lean protein. Do not eat a lot of foods that are high in solid fats, added sugars, or sodium. Maintain a healthy weight Body mass index (BMI) is a measurement that can be used to identify possible weight problems. It estimates body fat based on height and weight. Your health care provider can help determine your BMI and help you achieve or maintain a healthy weight. Get regular exercise Get regular exercise. This is one of the most important things you can do for your health. Most adults should: Exercise for at least 150 minutes each week. The exercise should increase your heart rate and make you sweat (moderate-intensity exercise). Do strengthening exercises at least twice a week. This is in addition to the moderate-intensity exercise. Spend less time sitting. Even light physical activity can be beneficial. Watch cholesterol and blood lipids Have your blood tested for lipids and cholesterol at 86 years of age, then have this test every 5 years. You may need to have your cholesterol levels checked more often if: Your lipid or cholesterol levels are high. You are older than 86 years of  age. Bonita Quin are at high risk for heart disease. What should I know about cancer screening? Many types of cancers can be detected early and may often be prevented. Depending on your health history and family history, you may need to have cancer screening at various ages. This may include screening for: Colorectal cancer. Prostate cancer. Skin cancer. Lung cancer. What should I know about heart disease, diabetes, and high blood  pressure? Blood pressure and heart disease High blood pressure causes heart disease and increases the risk of stroke. This is more likely to develop in people who have high blood pressure readings or are overweight. Talk with your health care provider about your target blood pressure readings. Have your blood pressure checked: Every 3-5 years if you are 63-42 years of age. Every year if you are 25 years old or older. If you are between the ages of 92 and 2 and are a current or former smoker, ask your health care provider if you should have a one-time screening for abdominal aortic aneurysm (AAA). Diabetes Have regular diabetes screenings. This checks your fasting blood sugar level. Have the screening done: Once every three years after age 71 if you are at a normal weight and have a low risk for diabetes. More often and at a younger age if you are overweight or have a high risk for diabetes. What should I know about preventing infection? Hepatitis B If you have a higher risk for hepatitis B, you should be screened for this virus. Talk with your health care provider to find out if you are at risk for hepatitis B infection. Hepatitis C Blood testing is recommended for: Everyone born from [birth year] through 1963. Anyone with known risk factors for hepatitis C. Sexually transmitted infections (STIs) You should be screened each year for STIs, including gonorrhea and chlamydia, if: You are sexually active and are younger than 86 years of age. You are older than 86 years of age and your health care provider tells you that you are at risk for this type of infection. Your sexual activity has changed since you were last screened, and you are at increased risk for chlamydia or gonorrhea. Ask your health care provider if you are at risk. Ask your health care provider about whether you are at high risk for HIV. Your health care provider may recommend a prescription medicine to help prevent HIV infection. If you  choose to take medicine to prevent HIV, you should first get tested for HIV. You should then be tested every 3 months for as long as you are taking the medicine. Follow these instructions at home: Alcohol use Do not drink alcohol if your health care provider tells you not to drink. If you drink alcohol: Limit how much you have to 0-2 drinks a day. Know how much alcohol is in your drink. In the [place] one drink equals one 12 oz bottle of beer (355 mL), one 5 oz glass of wine (148 mL), or one 1 oz glass of hard liquor (44 mL). Lifestyle Do not use any products that contain nicotine or tobacco. These products include cigarettes, chewing tobacco, and vaping devices, such as e-cigarettes. If you need help quitting, ask your health care provider. Do not use street drugs. Do not share needles. Ask your health care provider for help if you need support or information about quitting drugs. General instructions Schedule regular health, dental, and eye exams. Stay current with your vaccines. Tell your health care provider if: You often feel depressed.  You have ever been abused or do not feel safe at home. Summary Adopting a healthy lifestyle and getting preventive care are important in promoting health and wellness. Follow your health care provider's instructions about healthy diet, exercising, and getting tested or screened for diseases. Follow your health care provider's instructions on monitoring your cholesterol and blood pressure. This information is not intended to replace advice given to you by your health care provider. Make sure you discuss any questions you have with your health care provider. Document Revised: 09/10/2020 Document Reviewed: 09/10/2020 Elsevier Patient Education  2024 [employer].

## 2023-02-24 NOTE — Progress Notes (Signed)
MEDICARE ANNUAL WELLNESS VISIT  02/24/2023  Telephone Visit Disclaimer This Medicare AWV was conducted by telephone due to national recommendations for restrictions regarding the COVID-19 Pandemic (e.g. social distancing).  I verified, using two identifiers, that I am speaking with [name] or their authorized healthcare agent. I discussed the limitations, risks, security, and privacy concerns of performing an evaluation and management service by telephone and the potential availability of an in-person appointment in the future. The patient expressed understanding and agreed to proceed.  Location of Patient: Home Location of Provider (nurse):  In the office.  Subjective:    Cutler Patriarca is a 88 y.o. male patient of Breeback, Lonna Cobb, PA-C who had a Medicare Annual Wellness Visit today via telephone. Forster is Retired and lives with their friends. he does not have any living children. he reports that he is socially active and does interact with friends/family regularly. he is moderately physically active and enjoys music and water colors.  Patient Care Team: Nolene Ebbs as PCP - General (Family Medicine)     02/24/2023    2:10 PM 05/29/2017    3:37 PM 02/14/2015    3:00 PM  Advanced Directives  Does Patient Have a Medical Advance Directive? No Yes No  Would patient like information on creating a medical advance directive? No - Patient declined  No - patient declined information    Hospital Utilization Over the Past 12 Months: # of hospitalizations or ER visits: 1 # of surgeries: 0  Review of Systems    Patient reports that his overall health is unchanged compared to last year.  History obtained from chart review and the patient  Patient Reported Readings (BP, Pulse, CBG, Weight, etc) none Per patient no change in vitals since last visit, unable to obtain new vitals due to telehealth visit  Pain Assessment Pain : No/denies pain     Current Medications &  Allergies (verified) Allergies as of 02/24/2023       Reactions   Lyrica [pregabalin] Other (See Comments)   "made me a zombie"   Cephalexin Nausea And Vomiting   Clarithromycin Nausea And Vomiting   Novocain [procaine] Itching        Medication List        Accurate as of February 24, 2023  2:26 PM. If you have any questions, ask your nurse or doctor.          Accu-Chek Guide Control Liqd USE AS DIRECTED   Accu-Chek Guide Me w/Device Kit USE UP TO 4 TIMES DAILY AS  DIRECTED   Accu-Chek Guide test strip Generic drug: glucose blood USE TO CHECK BLOOD SUGAR UP TO 4 TIMES DAILY AS DIRECTED   Accu-Chek Softclix Lancets lancets USE UP TO 4 TIMES DAILY AS  DIRECTED   atenolol 25 MG tablet Commonly known as: TENORMIN Take 1 tablet (25 mg total) by mouth daily.   atorvastatin 10 MG tablet Commonly known as: LIPITOR TAKE 1 TABLET BY MOUTH DAILY   clonazePAM 1 MG tablet Commonly known as: KLONOPIN Take one-half tablet twice a day.   Colesevelam HCl 3.75 g Pack Take 1 packet by mouth in the morning, at noon, and at bedtime.   diclofenac Sodium 1 % Gel Commonly known as: VOLTAREN Apply 4 g topically 4 (four) times daily. To affected joint.   dicyclomine 10 MG capsule Commonly known as: BENTYL TAKE 1 CAPSULE BY MOUTH  TWICE DAILY BEFORE MEALS   DULoxetine 60 MG capsule Commonly known as: CYMBALTA  TAKE 1 CAPSULE BY MOUTH TWICE  DAILY   gabapentin 600 MG tablet Commonly known as: NEURONTIN TAKE 1 TABLET BY MOUTH 3 TIMES  DAILY FOR NERVE PAIN   glipiZIDE 10 MG tablet Commonly known as: GLUCOTROL Take 1 tablet (10 mg total) by mouth 2 (two) times daily before a meal.   hydrochlorothiazide 12.5 MG tablet Commonly known as: HYDRODIURIL TAKE 1 TABLET BY MOUTH DAILY   Lancets Misc. Misc Check fasting blood sugar every morning.   lisinopril 10 MG tablet Commonly known as: ZESTRIL Take 10 mg by mouth daily.   tamsulosin 0.4 MG Caps capsule Commonly known as:  FLOMAX Take 1 capsule (0.4 mg total) by mouth daily.   traMADol 50 MG tablet Commonly known as: ULTRAM Take 1-2 tablets every 8 hours as needed for moderate pain. Max 6 tablets daily.        History (reviewed): Past Medical History:  Diagnosis Date   Allergy    Atherosclerosis of aorta (HCC)    CAD (coronary artery disease)    Cardiomegaly    Chronic back pain    Depression    Diabetes mellitus without complication (HCC)    Erectile dysfunction 10/06/2011   Fatty liver    GERD (gastroesophageal reflux disease)    Hiatal hernia 10/06/2011   History of myocardial infarction    Hypertension    Mild mitral regurgitation 06/11/2017   Osteoarthritis 10/06/2011   Spinal stenosis    Umbilical hernia without obstruction or gangrene    small, mesenteric   Past Surgical History:  Procedure Laterality Date   CHOLECYSTECTOMY     COLONOSCOPY WITH ESOPHAGOGASTRODUODENOSCOPY (EGD)     ERCP     KNEE CARTILAGE SURGERY Bilateral    History reviewed. No pertinent family history. Social History   Socioeconomic History   Marital status: Widowed    Spouse name: Not on file   Number of children: 0   Years of education: 30   Highest education level: GED or equivalent  Occupational History   Occupation: Retired  Tobacco Use   Smoking status: Some Days    Types: Cigarettes    Start date: 06/20/2018   Smokeless tobacco: Never   Tobacco comments:    5 cigarettes a day   Vaping Use   Vaping status: Never Used  Substance and Sexual Activity   Alcohol use: No   Drug use: No   Sexual activity: Not Currently  Other Topics Concern   Not on file  Social History Narrative   Lives with friends. He enjoys music and water colors.   Social Determinants of Health   Financial Resource Strain: Low Risk  (02/24/2023)   Overall Financial Resource Strain (CARDIA)    Difficulty of Paying Living Expenses: Not hard at all  Food Insecurity: No Food Insecurity (02/24/2023)   Hunger Vital Sign     Worried About Running Out of Food in the Last Year: Never true    Ran Out of Food in the Last Year: Never true  Transportation Needs: No Transportation Needs (02/24/2023)   PRAPARE - Administrator, Civil Service (Medical): No    Lack of Transportation (Non-Medical): No  Physical Activity: Inactive (02/24/2023)   Exercise Vital Sign    Days of Exercise per Week: 0 days    Minutes of Exercise per Session: 0 min  Stress: No Stress Concern Present (02/24/2023)   [employer] of Occupational Health - Occupational Stress Questionnaire    Feeling of Stress : Not at all  Social Connections: Moderately Isolated (02/24/2023)   Social Connection and Isolation Panel [NHANES]    Frequency of Communication with Friends and Family: Twice a week    Frequency of Social Gatherings with Friends and Family: More than three times a week    Attends Religious Services: More than 4 times per year    Active Member of [employer] or Organizations: No    Attends Banker Meetings: Never    Marital Status: Widowed    Activities of Daily Living    02/24/2023    2:17 PM  In your present state of health, do you have any difficulty performing the following activities:  Hearing? 1  Comment some hearing loss  Vision? 0  Difficulty concentrating or making decisions? 0  Walking or climbing stairs? 1  Comment yes; due to the swelling of the knees  Dressing or bathing? 0  Doing errands, shopping? 0  Preparing Food and eating ? N  Using the Toilet? N  In the past six months, have you accidently leaked urine? N  Do you have problems with loss of bowel control? Y  Managing your Medications? N  Managing your Finances? N  Housekeeping or managing your Housekeeping? N    Patient Education/ Literacy How often do you need to have someone help you when you read instructions, pamphlets, or other written materials from your doctor or pharmacy?: 1 - Never What is the last grade level you  completed in school?: GED  Exercise    Diet Patient reports consuming 2 meals a day and 2 snack(s) a day Patient reports that his primary diet is: Regular Patient reports that she does have regular access to food.   Depression Screen    02/24/2023    2:11 PM 11/12/2022    3:12 PM 08/13/2022    3:53 PM 11/20/2021    2:47 PM 10/09/2021    1:45 PM 03/27/2021   10:49 AM 03/26/2020   10:46 AM  PHQ 2/9 Scores  PHQ - 2 Score 0 0 0 1 0 0 0  PHQ- 9 Score       5     Fall Risk    02/24/2023    2:10 PM 11/12/2022    3:12 PM 08/13/2022    3:53 PM 10/09/2021    1:45 PM 09/10/2020    3:53 PM  Fall Risk   Falls in the past year? 1 0 0 0 0  Number falls in past yr: 0 0 0 0 0  Injury with Fall? 0 0 0 0 0  Risk for fall due to : History of fall(s) No Fall Risks  No Fall Risks No Fall Risks  Follow up Falls evaluation completed;Education provided;Falls prevention discussed Falls evaluation completed  Falls prevention discussed;Falls evaluation completed Falls evaluation completed     Objective:  Alwyn Wimp seemed alert and oriented and he participated appropriately during our telephone visit.  Blood Pressure Weight BMI  BP Readings from Last 3 Encounters:  11/12/22 98/67  08/13/22 124/74  04/29/22 (!) 135/57   Wt Readings from Last 3 Encounters:  11/12/22 164 lb 12 oz (74.7 kg)  08/13/22 165 lb (74.8 kg)  04/29/22 158 lb (71.7 kg)   BMI Readings from Last 1 Encounters:  11/12/22 28.28 kg/m    *Unable to obtain current vital signs, weight, and BMI due to telephone visit type  Hearing/Vision  Matthias did not seem to have difficulty with hearing/understanding during the telephone conversation Reports that he has had a formal  eye exam by an eye care professional within the past year Reports that he has not had a formal hearing evaluation within the past year *Unable to fully assess hearing and vision during telephone visit type  Cognitive Function:    02/24/2023    2:19 PM  6CIT  Screen  What Year? 0 points  What month? 0 points  What time? 0 points  Count back from 20 0 points  Months in reverse 0 points  Repeat phrase 2 points  Total Score 2 points   (Normal:0-7, Significant for Dysfunction: >8)  Normal Cognitive Function Screening: Yes   Immunization & Health Maintenance Record Immunization History  Administered Date(s) Administered   Fluad Quad(high Dose 65+) 03/26/2020, 01/22/2021, 04/29/2022   Influenza, High Dose Seasonal PF 05/29/2017   PNEUMOCOCCAL CONJUGATE-20 10/09/2021   Pneumococcal Conjugate-13 01/03/2014   Pneumococcal Polysaccharide-23 09/14/2012   Td 09/30/2007   Tdap 10/09/2021    Health Maintenance  Topic Date Due   OPHTHALMOLOGY EXAM  05/11/2022   FOOT EXAM  02/24/2023 (Originally 10/10/2022)   Zoster Vaccines- Shingrix (1 of 2) 05/27/2023 (Originally 01/30/1954)   INFLUENZA VACCINE  08/03/2023 (Originally 12/04/2022)   HEMOGLOBIN A1C  05/15/2023   Medicare Annual Wellness (AWV)  02/24/2024   DTaP/Tdap/Td (3 - Td or Tdap) 10/10/2031   Pneumonia Vaccine 61+ Years old  Completed   HPV VACCINES  Aged Out   COVID-19 Vaccine  Discontinued       Assessment  This is a routine wellness examination for [employer]  Health Maintenance: Due or Overdue Health Maintenance Due  Topic Date Due   OPHTHALMOLOGY EXAM  05/11/2022    [name] does not need a referral for Community Assistance: Care Management:   no Social Work:    no Prescription Assistance:  no Nutrition/Diabetes Education:  no   Plan:  Personalized Goals  Goals Addressed               This Visit's Progress     Patient Stated (pt-stated)        Patient stated that he would like to keep his health good.        Personalized Health Maintenance & Screening Recommendations  Shingles vaccine Influenza vaccine Foot exam.  Lung Cancer Screening Recommended: no (Low Dose CT Chest recommended if Age 48-80 years, 20 pack-year currently smoking OR have quit w/in  past 15 years) Hepatitis C Screening recommended: no HIV Screening recommended: no  Advanced Directives: Written information was not prepared per patient's request.  Referrals & Orders No orders of the defined types were placed in this encounter.   Follow-up Plan Follow-up with Jomarie Longs, PA-C as planned Schedule influenza and shingles vaccine. Medicare wellness visit in one year.  AVS printed and mailed to the patient.    I have personally reviewed and noted the following in the patient's chart:   Medical and social history Use of alcohol, tobacco or illicit drugs  Current medications and supplements Functional ability and status Nutritional status Physical activity Advanced directives List of other physicians Hospitalizations, surgeries, and ER visits in previous 12 months Vitals Screenings to include cognitive, depression, and falls Referrals and appointments  In addition, I have reviewed and discussed with [name] certain preventive protocols, quality metrics, and best practice recommendations. A written personalized care plan for preventive services as well as general preventive health recommendations is available and can be mailed to the patient at his request.      Modesto Charon, RN BSN  02/24/2023      

## 2023-03-10 ENCOUNTER — Other Ambulatory Visit: Payer: Self-pay | Admitting: Physician Assistant

## 2023-03-10 DIAGNOSIS — F411 Generalized anxiety disorder: Secondary | ICD-10-CM

## 2023-03-19 ENCOUNTER — Other Ambulatory Visit: Payer: Self-pay | Admitting: Physician Assistant

## 2023-03-19 DIAGNOSIS — G8929 Other chronic pain: Secondary | ICD-10-CM

## 2023-03-19 DIAGNOSIS — F411 Generalized anxiety disorder: Secondary | ICD-10-CM

## 2023-03-19 NOTE — Telephone Encounter (Signed)
Patient is scheduled for his appointment and is requesting refills for Prescription Request  03/19/2023  LOV: 02/24/2023  What is the name of the medication or equipment?  Clonazepam 1mg   Tramodol 50mg   Have you contacted your pharmacy to request a refill? Yes   Which pharmacy would you like this sent to?   [facility] - [place], [place] - [address] [place] [address] Phone: [phone] Fax: [phone]     Patient notified that their request is being sent to the clinical staff for review and that they should receive a response within 2 business days.   Please advise at Mobile [phone]

## 2023-03-20 ENCOUNTER — Telehealth: Payer: Self-pay | Admitting: Physician Assistant

## 2023-03-20 DIAGNOSIS — F411 Generalized anxiety disorder: Secondary | ICD-10-CM

## 2023-03-20 DIAGNOSIS — G8929 Other chronic pain: Secondary | ICD-10-CM

## 2023-03-20 MED ORDER — TRAMADOL HCL 50 MG PO TABS
ORAL_TABLET | ORAL | 0 refills | Status: DC
Start: 2023-03-20 — End: 2023-03-20

## 2023-03-20 MED ORDER — CLONAZEPAM 1 MG PO TABS: ORAL_TABLET | ORAL | 0 refills | Status: DC

## 2023-03-20 MED ORDER — CLONAZEPAM 1 MG PO TABS
ORAL_TABLET | ORAL | 0 refills | Status: DC
Start: 2023-03-20 — End: 2023-03-30

## 2023-03-20 MED ORDER — TRAMADOL HCL 50 MG PO TABS: ORAL_TABLET | ORAL | 0 refills | Status: DC

## 2023-03-20 NOTE — Telephone Encounter (Signed)
Ms. Ria Bush called. Please send scripts for Clonazepam and Tramadol to [facility] and not [facility]. Mon-Fri pharmacy closes at 6 and on Saturday close at 2:00.  Hoping to get scripts today.

## 2023-03-30 ENCOUNTER — Encounter: Payer: Self-pay | Admitting: Physician Assistant

## 2023-03-30 ENCOUNTER — Ambulatory Visit (INDEPENDENT_AMBULATORY_CARE_PROVIDER_SITE_OTHER): Payer: Medicare Other | Admitting: Physician Assistant

## 2023-03-30 VITALS — BP 152/66 | HR 94 | Ht 64.0 in | Wt 163.5 lb

## 2023-03-30 DIAGNOSIS — G8929 Other chronic pain: Secondary | ICD-10-CM | POA: Diagnosis not present

## 2023-03-30 DIAGNOSIS — I152 Hypertension secondary to endocrine disorders: Secondary | ICD-10-CM

## 2023-03-30 DIAGNOSIS — I700 Atherosclerosis of aorta: Secondary | ICD-10-CM

## 2023-03-30 DIAGNOSIS — R351 Nocturia: Secondary | ICD-10-CM

## 2023-03-30 DIAGNOSIS — M25561 Pain in right knee: Secondary | ICD-10-CM

## 2023-03-30 DIAGNOSIS — M25562 Pain in left knee: Secondary | ICD-10-CM | POA: Diagnosis not present

## 2023-03-30 DIAGNOSIS — I499 Cardiac arrhythmia, unspecified: Secondary | ICD-10-CM

## 2023-03-30 DIAGNOSIS — E1142 Type 2 diabetes mellitus with diabetic polyneuropathy: Secondary | ICD-10-CM

## 2023-03-30 DIAGNOSIS — M539 Dorsopathy, unspecified: Secondary | ICD-10-CM

## 2023-03-30 DIAGNOSIS — M48062 Spinal stenosis, lumbar region with neurogenic claudication: Secondary | ICD-10-CM

## 2023-03-30 DIAGNOSIS — M4696 Unspecified inflammatory spondylopathy, lumbar region: Secondary | ICD-10-CM

## 2023-03-30 DIAGNOSIS — K580 Irritable bowel syndrome with diarrhea: Secondary | ICD-10-CM

## 2023-03-30 DIAGNOSIS — Z0289 Encounter for other administrative examinations: Secondary | ICD-10-CM

## 2023-03-30 DIAGNOSIS — Z7984 Long term (current) use of oral hypoglycemic drugs: Secondary | ICD-10-CM

## 2023-03-30 DIAGNOSIS — G629 Polyneuropathy, unspecified: Secondary | ICD-10-CM

## 2023-03-30 DIAGNOSIS — E1159 Type 2 diabetes mellitus with other circulatory complications: Secondary | ICD-10-CM

## 2023-03-30 DIAGNOSIS — F411 Generalized anxiety disorder: Secondary | ICD-10-CM

## 2023-03-30 MED ORDER — DICYCLOMINE HCL 10 MG PO CAPS: ORAL_CAPSULE | ORAL | 0 refills | Status: DC

## 2023-03-30 MED ORDER — GABAPENTIN 600 MG PO TABS: ORAL_TABLET | ORAL | 3 refills | Status: DC

## 2023-03-30 MED ORDER — TAMSULOSIN HCL 0.4 MG PO CAPS: 0.4000 mg | ORAL_CAPSULE | Freq: Every day | ORAL | 3 refills | Status: DC

## 2023-03-30 MED ORDER — ATENOLOL 25 MG PO TABS: 25.0000 mg | ORAL_TABLET | Freq: Every day | ORAL | 1 refills | Status: DC

## 2023-03-30 MED ORDER — GLIPIZIDE 10 MG PO TABS: 10.0000 mg | ORAL_TABLET | Freq: Two times a day (BID) | ORAL | 2 refills | Status: DC

## 2023-03-30 MED ORDER — CLONAZEPAM 1 MG PO TABS: ORAL_TABLET | ORAL | 1 refills | Status: DC

## 2023-03-30 MED ORDER — ATORVASTATIN CALCIUM 10 MG PO TABS: 10.0000 mg | ORAL_TABLET | Freq: Every day | ORAL | 3 refills | Status: DC

## 2023-03-30 MED ORDER — HYDROCHLOROTHIAZIDE 12.5 MG PO TABS: 12.5000 mg | ORAL_TABLET | Freq: Every day | ORAL | 2 refills | Status: DC

## 2023-03-30 MED ORDER — LISINOPRIL 10 MG PO TABS: 10.0000 mg | ORAL_TABLET | Freq: Every day | ORAL | 1 refills | Status: DC

## 2023-03-30 MED ORDER — DULOXETINE HCL 60 MG PO CPEP: 60.0000 mg | ORAL_CAPSULE | Freq: Two times a day (BID) | ORAL | 3 refills | Status: DC

## 2023-03-30 NOTE — Progress Notes (Unsigned)
Established Patient Office Visit  Subjective   Patient ID: Benjamin Ramirez, male    DOB: 20-Mar-1935  Age: 88 y.o. MRN: [id number]  Chief Complaint  Patient presents with   Medical Management of Chronic Issues    DM last A1C 6.6    HPI 88 yo male presents today for routine DM follow up. His last A1c was 6.6 on 11/12/22. Patient reports he has been trying to cut down on carbs.  He needs a refill on hydrochlorothiazide. He has been taking Lasix instead everyday.  Both knees still in pain and he is still taking Tramadol for pain.   Review of Systems  Cardiovascular:  Negative for leg swelling.  Musculoskeletal:  Positive for joint pain.      Objective:     BP (!) 152/66 (BP Location: Right Arm)   Pulse 94   Ht 5\' 4"  (1.626 m)   Wt 74.2 kg   SpO2 99%   BMI 28.06 kg/m  BP Readings from Last 3 Encounters:  03/30/23 (!) 152/66  11/12/22 98/67  08/13/22 124/74      Physical Exam Cardiovascular:     Rate and Rhythm: Rhythm irregular.     Pulses: Normal pulses.     Heart sounds: Normal heart sounds.  Pulmonary:     Effort: Pulmonary effort is normal.     Breath sounds: Normal breath sounds.  Musculoskeletal:        General: No swelling.      No results found for any visits on 03/30/23.  Last hemoglobin A1c Lab Results  Component Value Date   HGBA1C 6.6 (A) 11/12/2022      The ASCVD Risk score (Arnett DK, et al., 2019) failed to calculate for the following reasons:   The 2019 ASCVD risk score is only valid for ages 54 to 76   The patient has a prior MI or stroke diagnosis    Assessment & Plan:   Problem List Items Addressed This Visit       Cardiovascular and Mediastinum   Hypertension associated with diabetes (HCC)   Relevant Medications   atorvastatin (LIPITOR) 10 MG tablet   glipiZIDE (GLUCOTROL) 10 MG tablet   hydrochlorothiazide (HYDRODIURIL) 12.5 MG tablet   atenolol (TENORMIN) 25 MG tablet   lisinopril (ZESTRIL) 10 MG tablet   Other Relevant  Orders   CMP14+EGFR   CBC w/Diff/Platelet   Aortic atherosclerosis (HCC)   Relevant Medications   atorvastatin (LIPITOR) 10 MG tablet   hydrochlorothiazide (HYDRODIURIL) 12.5 MG tablet   atenolol (TENORMIN) 25 MG tablet   lisinopril (ZESTRIL) 10 MG tablet     Digestive   Irritable bowel syndrome with diarrhea   Relevant Medications   dicyclomine (BENTYL) 10 MG capsule     Endocrine   Controlled type 2 diabetes mellitus with diabetic polyneuropathy, without long-term current use of insulin (HCC) - Primary   Relevant Medications   atorvastatin (LIPITOR) 10 MG tablet   DULoxetine (CYMBALTA) 60 MG capsule   glipiZIDE (GLUCOTROL) 10 MG tablet   clonazePAM (KLONOPIN) 1 MG tablet   gabapentin (NEURONTIN) 600 MG tablet   lisinopril (ZESTRIL) 10 MG tablet   Other Relevant Orders   Hemoglobin A1c   CMP14+EGFR   CBC w/Diff/Platelet     Nervous and Auditory   Peripheral polyneuropathy   Relevant Medications   DULoxetine (CYMBALTA) 60 MG capsule   clonazePAM (KLONOPIN) 1 MG tablet   gabapentin (NEURONTIN) 600 MG tablet     Musculoskeletal and Integument   Spondylitis (HCC)  Relevant Medications   DULoxetine (CYMBALTA) 60 MG capsule   Multilevel degenerative disc disease   Relevant Medications   DULoxetine (CYMBALTA) 60 MG capsule     Other   Neurogenic claudication due to lumbar spinal stenosis   Relevant Medications   DULoxetine (CYMBALTA) 60 MG capsule   clonazePAM (KLONOPIN) 1 MG tablet   gabapentin (NEURONTIN) 600 MG tablet   Pain management contract signed   Relevant Medications   DULoxetine (CYMBALTA) 60 MG capsule   Chronic pain of both knees   Relevant Medications   DULoxetine (CYMBALTA) 60 MG capsule   clonazePAM (KLONOPIN) 1 MG tablet   gabapentin (NEURONTIN) 600 MG tablet   Other Relevant Orders   CMP14+EGFR   GAD (generalized anxiety disorder)   Relevant Medications   DULoxetine (CYMBALTA) 60 MG capsule   clonazePAM (KLONOPIN) 1 MG tablet   Regularly  irregular pulse rhythym   Relevant Medications   hydrochlorothiazide (HYDRODIURIL) 12.5 MG tablet   atenolol (TENORMIN) 25 MG tablet   Other Visit Diagnoses     Nocturia       Relevant Medications   tamsulosin (FLOMAX) 0.4 MG CAPS capsule      Ordered A1c, results pending. UTD on foot exam.  Refilled hydrochlorothiazide. Advised patient to take hydrochlorothiazide every day. Advised patient to not take Lasix every day and use only as needed for increase in leg swelling. Advised patient to not take Lisinopril if his SBP is < 105.  Pain contract completed.  On statin.  Return in about 3 months (around 06/30/2023).    AnnaCollin M Mace, Student-PA

## 2023-03-31 LAB — CBC WITH DIFFERENTIAL/PLATELET
Basophils Absolute: 0 10*3/uL (ref 0.0–0.2)
Basos: 0 %
EOS (ABSOLUTE): 0.2 10*3/uL (ref 0.0–0.4)
Eos: 2 %
Hematocrit: 53 % — ABNORMAL HIGH (ref 37.5–51.0)
Hemoglobin: 17.4 g/dL (ref 13.0–17.7)
Immature Grans (Abs): 0 10*3/uL (ref 0.0–0.1)
Immature Granulocytes: 1 %
Lymphocytes Absolute: 1.4 10*3/uL (ref 0.7–3.1)
Lymphs: 17 %
MCH: 30.8 pg (ref 26.6–33.0)
MCHC: 32.8 g/dL (ref 31.5–35.7)
MCV: 94 fL (ref 79–97)
Monocytes Absolute: 0.4 10*3/uL (ref 0.1–0.9)
Monocytes: 5 %
Neutrophils Absolute: 6 10*3/uL (ref 1.4–7.0)
Neutrophils: 75 %
Platelets: 180 10*3/uL (ref 150–450)
RBC: 5.65 x10E6/uL (ref 4.14–5.80)
RDW: 13.1 % (ref 11.6–15.4)
WBC: 8.1 10*3/uL (ref 3.4–10.8)

## 2023-03-31 LAB — HEMOGLOBIN A1C
Est. average glucose Bld gHb Est-mCnc: 137 mg/dL
Hgb A1c MFr Bld: 6.4 % — ABNORMAL HIGH (ref 4.8–5.6)

## 2023-03-31 LAB — CMP14+EGFR
ALT: 16 [IU]/L (ref 0–44)
AST: 14 [IU]/L (ref 0–40)
Albumin: 4.6 g/dL (ref 3.7–4.7)
Alkaline Phosphatase: 87 [IU]/L (ref 44–121)
BUN/Creatinine Ratio: 21 (ref 10–24)
BUN: 25 mg/dL (ref 8–27)
Bilirubin Total: 0.5 mg/dL (ref 0.0–1.2)
CO2: 24 mmol/L (ref 20–29)
Calcium: 9.9 mg/dL (ref 8.6–10.2)
Chloride: 103 mmol/L (ref 96–106)
Creatinine, Ser: 1.2 mg/dL (ref 0.76–1.27)
Globulin, Total: 2.7 g/dL (ref 1.5–4.5)
Glucose: 118 mg/dL — ABNORMAL HIGH (ref 70–99)
Potassium: 5.1 mmol/L (ref 3.5–5.2)
Sodium: 142 mmol/L (ref 134–144)
Total Protein: 7.3 g/dL (ref 6.0–8.5)
eGFR: 59 mL/min/{1.73_m2} — ABNORMAL LOW (ref >59)

## 2023-03-31 NOTE — Progress Notes (Signed)
Zakye,   Average sugars have improved! Kidney function has improved a lot as well great news!

## 2023-04-21 ENCOUNTER — Ambulatory Visit
Admission: EM | Admit: 2023-04-21 | Discharge: 2023-04-21 | Disposition: A | Discharge status: Home / Self Care | Payer: Medicare Other | Attending: Family Medicine | Admitting: Family Medicine

## 2023-04-21 ENCOUNTER — Encounter: Payer: Self-pay | Admitting: Emergency Medicine

## 2023-04-21 DIAGNOSIS — S0101XA Laceration without foreign body of scalp, initial encounter: Secondary | ICD-10-CM | POA: Diagnosis not present

## 2023-04-21 NOTE — Discharge Instructions (Signed)
Stitches need to come out in about a week Call or return sooner if you see any signs of infection (redness, swelling, increased pain)

## 2023-04-21 NOTE — ED Provider Notes (Signed)
[facility]    CSN: [id number] Arrival date & time: 04/21/23  1431      History   Chief Complaint Chief Complaint  Patient presents with   Laceration    HPI Benjamin Ramirez is a 88 y.o. male.   HPI  Patient was walking on hardwood floors with only socks on today slipped and fell.  Hit his right side of his head on the floor.  No loss of consciousness.  Fall was witnessed.  He is here for a laceration.  Denies other injury  Past Medical History:  Diagnosis Date   Allergy    Atherosclerosis of aorta (HCC)    CAD (coronary artery disease)    Cardiomegaly    Chronic back pain    Depression    Diabetes mellitus without complication (HCC)    Erectile dysfunction 10/06/2011   Fatty liver    GERD (gastroesophageal reflux disease)    Hiatal hernia 10/06/2011   History of myocardial infarction    Hypertension    Mild mitral regurgitation 06/11/2017   Osteoarthritis 10/06/2011   Spinal stenosis    Umbilical hernia without obstruction or gangrene    small, mesenteric    Patient Active Problem List   Diagnosis Date Noted   Primary osteoarthritis 04/29/2022   Irritable bowel syndrome with diarrhea 04/29/2022   Bilateral lower extremity edema 03/27/2021   Dizziness 03/27/2021   GAD (generalized anxiety disorder) 06/12/2020   Irregular heart rhythm 01/03/2020   Primary osteoarthritis of both knees 06/27/2019   CKD (chronic kidney disease) stage 3, GFR 30-59 ml/min (HCC) 06/22/2019   Hyperlipidemia LDL goal <70 06/22/2019   Chronic pain of both knees 06/22/2019   Multilevel degenerative disc disease 06/22/2019   Controlled type 2 diabetes mellitus with diabetic polyneuropathy, without long-term current use of insulin (HCC) 06/22/2019   Mild mitral regurgitation 06/11/2017   Hearing difficulty of both ears 06/05/2017   Peripheral polyneuropathy 06/05/2017   Aortic atherosclerosis (HCC) 06/05/2017   Venous stasis dermatitis of both lower extremities 06/05/2017   Muscle  atrophy of lower extremity 06/05/2017   Onychomycosis due to dermatophyte 06/05/2017   History of echocardiogram 06/05/2017   Cardiomegaly 06/05/2017   Umbilical hernia without obstruction or gangrene    Fatty liver    Spinal stenosis 05/29/2017   Hypertension associated with diabetes (HCC) 05/29/2017   Insomnia secondary to chronic pain 05/29/2017   Encounter for chronic pain management 05/29/2017   Neurogenic claudication due to lumbar spinal stenosis 05/29/2017   History of myocardial infarction    Chronic sinusitis 10/06/2011   Erectile dysfunction 10/06/2011   GERD (gastroesophageal reflux disease) 10/06/2011   Hiatal hernia 10/06/2011   Spondylitis (HCC) 10/06/2011    Past Surgical History:  Procedure Laterality Date   CHOLECYSTECTOMY     COLONOSCOPY WITH ESOPHAGOGASTRODUODENOSCOPY (EGD)     ERCP     KNEE CARTILAGE SURGERY Bilateral        Home Medications    Prior to Admission medications   Medication Sig Start Date End Date Taking? Authorizing Provider  Accu-Chek Softclix Lancets lancets USE UP TO 4 TIMES DAILY AS  DIRECTED 02/03/23  Yes Breeback, Jade L, PA-C  atenolol (TENORMIN) 25 MG tablet Take 1 tablet (25 mg total) by mouth daily. 03/30/23  Yes Breeback, Jade L, PA-C  atorvastatin (LIPITOR) 10 MG tablet Take 1 tablet (10 mg total) by mouth daily. 03/30/23  Yes Breeback, Jade L, PA-C  Blood Glucose Calibration (ACCU-CHEK GUIDE CONTROL) LIQD USE AS DIRECTED 06/13/22  Yes  Breeback, Jade L, PA-C  Blood Glucose Monitoring Suppl (ACCU-CHEK GUIDE ME) w/Device KIT USE UP TO 4 TIMES DAILY AS  DIRECTED 06/13/22  Yes Breeback, Jade L, PA-C  clonazePAM (KLONOPIN) 1 MG tablet Take one-half tablet twice a day. 03/30/23  Yes Breeback, Jade L, PA-C  diclofenac Sodium (VOLTAREN) 1 % GEL Apply 4 g topically 4 (four) times daily. To affected joint. 11/12/22  Yes Breeback, Jade L, PA-C  dicyclomine (BENTYL) 10 MG capsule TAKE 1 CAPSULE BY MOUTH  TWICE DAILY BEFORE MEALS 03/30/23  Yes  Breeback, Jade L, PA-C  DULoxetine (CYMBALTA) 60 MG capsule Take 1 capsule (60 mg total) by mouth 2 (two) times daily. 03/30/23  Yes Breeback, Jade L, PA-C  gabapentin (NEURONTIN) 600 MG tablet TAKE 1 TABLET BY MOUTH 3 TIMES  DAILY FOR NERVE PAIN 03/30/23  Yes Breeback, Jade L, PA-C  glipiZIDE (GLUCOTROL) 10 MG tablet Take 1 tablet (10 mg total) by mouth 2 (two) times daily before a meal. 03/30/23  Yes Breeback, Jade L, PA-C  glucose blood (ACCU-CHEK GUIDE) test strip USE TO CHECK BLOOD SUGAR UP TO 4 TIMES DAILY AS DIRECTED 02/03/23  Yes Breeback, Jade L, PA-C  hydrochlorothiazide (HYDRODIURIL) 12.5 MG tablet Take 1 tablet (12.5 mg total) by mouth daily. 03/30/23  Yes Breeback, Jade L, PA-C  Lancets Misc. MISC Check fasting blood sugar every morning. 01/18/20  Yes Breeback, Jade L, PA-C  lisinopril (ZESTRIL) 10 MG tablet Take 1 tablet (10 mg total) by mouth daily. 03/30/23  Yes Breeback, Jade L, PA-C  tamsulosin (FLOMAX) 0.4 MG CAPS capsule Take 1 capsule (0.4 mg total) by mouth daily. 03/30/23  Yes Breeback, Jade L, PA-C  traMADol (ULTRAM) 50 MG tablet Take 1-2 tablets every 8 hours as needed for moderate pain. Max 6 tablets daily. 03/20/23  Yes Jomarie Longs, PA-C    Family History History reviewed. No pertinent family history.  Social History Social History   Tobacco Use   Smoking status: Some Days    Types: Cigarettes    Start date: 06/20/2018   Smokeless tobacco: Never   Tobacco comments:    5 cigarettes a day   Vaping Use   Vaping status: Never Used  Substance Use Topics   Alcohol use: No   Drug use: No     Allergies   Lyrica [pregabalin], Cephalexin, Clarithromycin, and Novocain [procaine]   Review of Systems Review of Systems See HPI  Physical Exam Triage Vital Signs ED Triage Vitals  Encounter Vitals Group     BP 04/21/23 1449 134/78     Systolic BP Percentile --      Diastolic BP Percentile --      Pulse Rate 04/21/23 1449 97     Resp 04/21/23 1449 18      Temp 04/21/23 1449 98.2 F (36.8 C)     Temp Source 04/21/23 1449 Oral     SpO2 04/21/23 1449 99 %     Weight --      Height --      Head Circumference --      Peak Flow --      Pain Score 04/21/23 1451 2     Pain Loc --      Pain Education --      Exclude from Growth Chart --    No data found.  Updated Vital Signs BP 134/78 (BP Location: Left Arm)   Pulse 97   Temp 98.2 F (36.8 C) (Oral)   Resp 18   SpO2  99%      Physical Exam HENT:     Head: Normocephalic.       UC Treatments / Results  Labs (all labs ordered are listed, but only abnormal results are displayed) Labs Reviewed - No data to display  EKG   Radiology No results found.  Procedures Laceration Repair  Date/Time: 04/21/2023 3:55 PM  Performed by: Eustace Moore, MD Authorized by: Eustace Moore, MD   Consent:    Consent obtained:  Verbal   Consent given by:  Patient   Risks discussed:  Infection and pain   Alternatives discussed:  No treatment Universal protocol:    Patient identity confirmed:  Verbally with patient Anesthesia:    Anesthesia method:  Local infiltration Laceration details:    Location:  Scalp   Length (cm):  3   Depth (mm):  5 Pre-procedure details:    Preparation:  Patient was prepped and draped in usual sterile fashion Exploration:    Hemostasis achieved with:  Direct pressure Treatment:    Area cleansed with:  Shur-Clens   Amount of cleaning:  Standard   Debridement:  None   Undermining:  None Skin repair:    Repair method:  Sutures   Suture size:  4-0   Suture material:  Prolene   Suture technique:  Simple interrupted   Number of sutures:  3 Approximation:    Approximation:  Close Repair type:    Repair type:  Simple Post-procedure details:    Dressing:  Antibiotic ointment   Procedure completion:  Tolerated well, no immediate complications Comments:     Patient states that he is allergic to procaine.  States that it caused him to have  swelling in his feet and ankles.  I injected the surrounding tissues with normal saline.  Patient tolerated procedure well  (including critical care time)  Medications Ordered in UC Medications - No data to display  Initial Impression / Assessment and Plan / UC Course  I have reviewed the triage vital signs and the nursing notes.  Pertinent labs & imaging results that were available during my care of the patient were reviewed by me and considered in my medical decision making (see chart for details).     Wound care discussed Final Clinical Impressions(s) / UC Diagnoses   Final diagnoses:  Scalp laceration, initial encounter     Discharge Instructions      Stitches need to come out in about a week Call or return sooner if you see any signs of infection (redness, swelling, increased pain)    ED Prescriptions   None    PDMP not reviewed this encounter.   Eustace Moore, MD 04/21/23 [phone]

## 2023-04-21 NOTE — ED Notes (Signed)
Bacitracin applied to wound per provider request

## 2023-04-21 NOTE — ED Triage Notes (Signed)
Patient states that he was walking on wooden floors with "stockings" on and slipped, hit the right side of his head today.  Laceration on the right side of head.  No LOC.  Tdap 10/09/2021.

## 2023-04-24 ENCOUNTER — Other Ambulatory Visit: Payer: Self-pay | Admitting: Physician Assistant

## 2023-04-24 DIAGNOSIS — E1142 Type 2 diabetes mellitus with diabetic polyneuropathy: Secondary | ICD-10-CM

## 2023-05-11 ENCOUNTER — Other Ambulatory Visit: Payer: Self-pay | Admitting: Physician Assistant

## 2023-05-11 DIAGNOSIS — G8929 Other chronic pain: Secondary | ICD-10-CM

## 2023-05-11 NOTE — Telephone Encounter (Signed)
 Copied from CRM [id number]. Topic: Clinical - Medication Refill >> May 11, 2023  1:48 PM Leotis ORN wrote: Most Recent Primary Care Visit:  Provider: ANTONIETTE VERMELL CROME  Department: [facility]  Visit Type: OFFICE VISIT  Date: 03/30/2023  Medication: diclofenac  Sodium (VOLTAREN ) 1 % GEL  Has the patient contacted their pharmacy? Yes (Agent: If no, request that the patient contact the pharmacy for the refill. If patient does not wish to contact the pharmacy document the reason why and proceed with request.) (Agent: If yes, when and what did the pharmacy advise?)  Is this the correct pharmacy for this prescription? Yes If no, delete pharmacy and type the correct one.  This is the patient's preferred pharmacy:   [id number] - [place], [place] - [address] AT [facility] [address] [place] [address] Phone: [phone] Fax: [phone]  Has the prescription been filled recently? Yes  Is the patient out of the medication? Yes  Has the patient been seen for an appointment in the last year OR does the patient have an upcoming appointment? Yes  Can we respond through MyChart? Yes  Agent: Please be advised that Rx refills may take up to 3 business days. We ask that you follow-up with your pharmacy.

## 2023-05-12 MED ORDER — DICLOFENAC SODIUM 1 % EX GEL: 4.0000 g | Freq: Four times a day (QID) | CUTANEOUS | 1 refills | Status: DC

## 2023-05-13 ENCOUNTER — Telehealth: Payer: Self-pay

## 2023-05-13 DIAGNOSIS — G8929 Other chronic pain: Secondary | ICD-10-CM

## 2023-05-13 MED ORDER — DICLOFENAC SODIUM 1 % EX GEL: 4.0000 g | Freq: Four times a day (QID) | CUTANEOUS | 1 refills | Status: DC

## 2023-05-13 NOTE — Telephone Encounter (Signed)
 Medication sent yesterday

## 2023-05-13 NOTE — Telephone Encounter (Signed)
 Copied from CRM [id number]. Topic: Clinical - Prescription Issue >> May 11, 2023  3:20 PM Benjamin Ramirez wrote: Reason for CRM: Caller stated she recently called for refill request and requested it go to the incorrect pharmacy. Refill for diclofenac  Sodium (VOLTAREN ) 1 % GEL should be sent to [facility] [place], [place] - [address] instead in order to be covered by insurance. Thank You

## 2023-05-13 NOTE — Telephone Encounter (Signed)
 Sent to [facility]

## 2023-05-13 NOTE — Telephone Encounter (Signed)
 Copied from CRM [id number]. Topic: Clinical - Medication Refill >> May 11, 2023  1:48 PM Leotis ORN wrote: Most Recent Primary Care Visit:  Provider: ANTONIETTE VERMELL CROME  Department: [facility]  Visit Type: OFFICE VISIT  Date: 03/30/2023  Medication: diclofenac  Sodium (VOLTAREN ) 1 % GEL  Has the patient contacted their pharmacy? Yes (Agent: If no, request that the patient contact the pharmacy for the refill. If patient does not wish to contact the pharmacy document the reason why and proceed with request.) (Agent: If yes, when and what did the pharmacy advise?)  Is this the correct pharmacy for this prescription? Yes If no, delete pharmacy and type the correct one.  This is the patient's preferred pharmacy:   [id number] - [place], [place] - [address] AT [facility] [address] [place] [address] Phone: [phone] Fax: [phone]  Has the prescription been filled recently? Yes  Is the patient out of the medication? Yes  Has the patient been seen for an appointment in the last year OR does the patient have an upcoming appointment? Yes  Can we respond through MyChart? Yes  Agent: Please be advised that Rx refills may take up to 3 business days. We ask that you follow-up with your pharmacy.

## 2023-05-16 ENCOUNTER — Other Ambulatory Visit: Payer: Self-pay | Admitting: Physician Assistant

## 2023-05-16 DIAGNOSIS — F411 Generalized anxiety disorder: Secondary | ICD-10-CM

## 2023-05-20 ENCOUNTER — Telehealth: Payer: Self-pay | Admitting: Physician Assistant

## 2023-05-20 DIAGNOSIS — F411 Generalized anxiety disorder: Secondary | ICD-10-CM

## 2023-05-20 NOTE — Telephone Encounter (Signed)
Copied from CRM [id number]. Topic: Clinical - Prescription Issue >> May 20, 2023  3:50 PM Benjamin Ramirez wrote: Reason for CRM:  clonazePAM (KLONOPIN) 1 MG was sent to [facility]. He wants it sent to [facility] because he is out of his medication. Please call him with any questions

## 2023-05-21 NOTE — Telephone Encounter (Signed)
In checking patient chart - shows this has already been resolved - clonazepam shows being sent to [facility] pharmacy yesterday.

## 2023-05-26 ENCOUNTER — Telehealth (INDEPENDENT_AMBULATORY_CARE_PROVIDER_SITE_OTHER): Payer: Medicare Other | Admitting: Physician Assistant

## 2023-05-26 ENCOUNTER — Other Ambulatory Visit: Payer: Self-pay | Admitting: Physician Assistant

## 2023-05-26 VITALS — BP 127/67

## 2023-05-26 DIAGNOSIS — M25562 Pain in left knee: Secondary | ICD-10-CM

## 2023-05-26 DIAGNOSIS — G629 Polyneuropathy, unspecified: Secondary | ICD-10-CM

## 2023-05-26 DIAGNOSIS — G8929 Other chronic pain: Secondary | ICD-10-CM

## 2023-05-26 DIAGNOSIS — M4807 Spinal stenosis, lumbosacral region: Secondary | ICD-10-CM

## 2023-05-26 DIAGNOSIS — M170 Bilateral primary osteoarthritis of knee: Secondary | ICD-10-CM

## 2023-05-26 DIAGNOSIS — M25561 Pain in right knee: Secondary | ICD-10-CM

## 2023-05-26 MED ORDER — TRAMADOL HCL 50 MG PO TABS: ORAL_TABLET | ORAL | 0 refills | Status: DC

## 2023-05-26 MED ORDER — GABAPENTIN 600 MG PO TABS: ORAL_TABLET | ORAL | 3 refills | Status: DC

## 2023-05-26 NOTE — Progress Notes (Signed)
..Virtual Visit via Telephone Note  I connected with [name] on 05/26/23 at  3:20 PM EST by telephone and verified that I am speaking with the correct person using two identifiers.  Location: Patient: home Provider: clinic  .Marland KitchenParticipating in visit:  Patient: Benjamin Ramirez Provider: Tandy Gaw PA-C Provider in training: Gareth Eagle PA-S   I discussed the limitations, risks, security and privacy concerns of performing an evaluation and management service by telephone and the availability of in person appointments. I also discussed with the patient that there may be a patient responsible charge related to this service. The patient expressed understanding and agreed to proceed.   History of Present Illness: Pt is a 89 yo male who calls into the clinic needing tramadol and gabapentin refilled for pain. He has no other concerns. He has been out of his tramadol for a few days and pain much worse in low back and knees.    .. Active Ambulatory Problems    Diagnosis Date Noted   Chronic sinusitis 10/06/2011   Erectile dysfunction 10/06/2011   GERD (gastroesophageal reflux disease) 10/06/2011   Hiatal hernia 10/06/2011   Spinal stenosis 05/29/2017   Spondylitis (HCC) 10/06/2011   History of myocardial infarction    Hypertension associated with diabetes (HCC) 05/29/2017   Insomnia secondary to chronic pain 05/29/2017   Encounter for chronic pain management 05/29/2017   Neurogenic claudication due to lumbar spinal stenosis 05/29/2017   Umbilical hernia without obstruction or gangrene    Fatty liver    Hearing difficulty of both ears 06/05/2017   Peripheral polyneuropathy 06/05/2017   Aortic atherosclerosis (HCC) 06/05/2017   Venous stasis dermatitis of both lower extremities 06/05/2017   Muscle atrophy of lower extremity 06/05/2017   Onychomycosis due to dermatophyte 06/05/2017   History of echocardiogram 06/05/2017   Cardiomegaly 06/05/2017   Mild mitral regurgitation 06/11/2017    CKD (chronic kidney disease) stage 3, GFR 30-59 ml/min (HCC) 06/22/2019   Hyperlipidemia LDL goal <70 06/22/2019   Chronic pain of both knees 06/22/2019   Multilevel degenerative disc disease 06/22/2019   Controlled type 2 diabetes mellitus with diabetic polyneuropathy, without long-term current use of insulin (HCC) 06/22/2019   Primary osteoarthritis of both knees 06/27/2019   Irregular heart rhythm 01/03/2020   GAD (generalized anxiety disorder) 06/12/2020   Bilateral lower extremity edema 03/27/2021   Dizziness 03/27/2021   Primary osteoarthritis 04/29/2022   Irritable bowel syndrome with diarrhea 04/29/2022   Resolved Ambulatory Problems    Diagnosis Date Noted   Diabetes mellitus with neurological manifestations, controlled (HCC) 07/26/2014   Myocardial infarction (HCC) 10/06/2011   Osteoarthritis 10/06/2011   S/P ERCP 09/22/2011   Seasonal allergies 10/06/2011   Therapeutic opioid induced constipation 05/29/2017   Diet-controlled diabetes mellitus (HCC) 06/05/2017   Pain management contract signed 06/05/2017   Elevated serum creatinine 02/16/2018   Acute cystitis with hematuria 02/16/2018   Anxiety 06/22/2019   Sinus arrhythmia seen on electrocardiogram 01/05/2020   Uncontrolled type 2 diabetes mellitus with hyperglycemia (HCC) 04/02/2020   Regularly irregular pulse rhythym 03/27/2021   Past Medical History:  Diagnosis Date   Allergy    Atherosclerosis of aorta (HCC)    CAD (coronary artery disease)    Chronic back pain    Depression    Diabetes mellitus without complication (HCC)    Hypertension     Observations/Objective: No acute distress Normal mood    Assessment and Plan: Marland KitchenMarland KitchenDiagnoses and all orders for this visit:  Chronic pain of both knees -  traMADol (ULTRAM) 50 MG tablet; Take 1-2 tablets every 8 hours as needed for moderate pain. Max 6 tablets daily.  Peripheral polyneuropathy -     gabapentin (NEURONTIN) 600 MG tablet; TAKE 1 TABLET BY MOUTH 3  TIMES  DAILY FOR NERVE PAIN  Primary osteoarthritis of both knees  Spinal stenosis of lumbosacral region   Pain contract is UTD .Marland KitchenPDMP reviewed during this encounter. No concerns Refilled gabapentin and tramadol today Keep appt every 3 months   Follow Up Instructions:    I discussed the assessment and treatment plan with the patient. The patient was provided an opportunity to ask questions and all were answered. The patient agreed with the plan and demonstrated an understanding of the instructions.   The patient was advised to call back or seek an in-person evaluation if the symptoms worsen or if the condition fails to improve as anticipated.  I provided 7 minutes of non-face-to-face time during this encounter.   Tandy Gaw, PA-C

## 2023-05-29 ENCOUNTER — Encounter: Payer: Self-pay | Admitting: Physician Assistant

## 2023-06-09 ENCOUNTER — Other Ambulatory Visit: Payer: Self-pay | Admitting: Physician Assistant

## 2023-06-09 DIAGNOSIS — E1142 Type 2 diabetes mellitus with diabetic polyneuropathy: Secondary | ICD-10-CM

## 2023-06-16 ENCOUNTER — Other Ambulatory Visit: Payer: Self-pay | Admitting: Physician Assistant

## 2023-06-16 DIAGNOSIS — E1142 Type 2 diabetes mellitus with diabetic polyneuropathy: Secondary | ICD-10-CM

## 2023-06-16 DIAGNOSIS — I152 Hypertension secondary to endocrine disorders: Secondary | ICD-10-CM

## 2023-06-17 ENCOUNTER — Other Ambulatory Visit: Payer: Self-pay | Admitting: Physician Assistant

## 2023-06-17 DIAGNOSIS — F411 Generalized anxiety disorder: Secondary | ICD-10-CM

## 2023-06-18 ENCOUNTER — Other Ambulatory Visit: Payer: Self-pay | Admitting: Physician Assistant

## 2023-06-18 DIAGNOSIS — F411 Generalized anxiety disorder: Secondary | ICD-10-CM

## 2023-06-18 NOTE — Telephone Encounter (Signed)
Last filled 05/20/2023  Last office visit 05/26/2023  Upcoming appointment 06/30/2023

## 2023-06-18 NOTE — Telephone Encounter (Unsigned)
Copied from CRM [id number]. Topic: Clinical - Medication Refill >> Jun 18, 2023 11:45 AM Dimitri Ped wrote: Most Recent Primary Care Visit:  Provider: Jomarie Longs  Department: [facility]  Visit Type: MYCHART VIDEO VISIT  Date: 05/26/2023  Medication: clonazePAM (KLONOPIN) 1 MG tablet   Has the patient contacted their pharmacy? Yes no reason provided . Patient friend doesn't understand  (Agent: If no, request that the patient contact the pharmacy for the refill. If patient does not wish to contact the pharmacy document the reason why and proceed with request.) (Agent: If yes, when and what did the pharmacy advise?)  Is this the correct pharmacy for this prescription? Yes If no, delete pharmacy and type the correct one.  This is the patient's preferred pharmacy:  [facility] #[id number] - [place], [place] - [address] AT [facility] [address] [place] [address] Phone: [phone] Fax: [phone]  [facility] - [place], [place] - [address] [place] [address] Phone: [phone] Fax: [phone]  [facility] - [place], [place] - [address] [place] [address] [place] [address] Phone: [phone] Fax: [phone]   Has the prescription been filled recently? No  Is the patient out of the medication? Yes  Has the patient been seen for an appointment in the last year OR does the patient have an upcoming appointment? Yes  Can we respond through MyChart? No  Agent: Please be advised that Rx refills may take up to 3 business days. We ask that you follow-up with your pharmacy.

## 2023-06-19 MED ORDER — CLONAZEPAM 1 MG PO TABS: 0.5000 mg | ORAL_TABLET | Freq: Two times a day (BID) | ORAL | 0 refills | Status: DC

## 2023-06-24 ENCOUNTER — Other Ambulatory Visit: Payer: Self-pay | Admitting: Physician Assistant

## 2023-06-24 DIAGNOSIS — I499 Cardiac arrhythmia, unspecified: Secondary | ICD-10-CM

## 2023-06-30 ENCOUNTER — Encounter: Payer: Self-pay | Admitting: Physician Assistant

## 2023-06-30 ENCOUNTER — Ambulatory Visit: Payer: Medicare Other | Admitting: Physician Assistant

## 2023-06-30 VITALS — BP 115/53 | HR 60 | Ht 64.0 in | Wt 169.2 lb

## 2023-06-30 DIAGNOSIS — Z23 Encounter for immunization: Secondary | ICD-10-CM | POA: Diagnosis not present

## 2023-06-30 DIAGNOSIS — I499 Cardiac arrhythmia, unspecified: Secondary | ICD-10-CM | POA: Diagnosis not present

## 2023-06-30 DIAGNOSIS — K580 Irritable bowel syndrome with diarrhea: Secondary | ICD-10-CM

## 2023-06-30 DIAGNOSIS — G8929 Other chronic pain: Secondary | ICD-10-CM

## 2023-06-30 DIAGNOSIS — M25562 Pain in left knee: Secondary | ICD-10-CM | POA: Diagnosis not present

## 2023-06-30 DIAGNOSIS — M25561 Pain in right knee: Secondary | ICD-10-CM | POA: Diagnosis not present

## 2023-06-30 DIAGNOSIS — G629 Polyneuropathy, unspecified: Secondary | ICD-10-CM | POA: Diagnosis not present

## 2023-06-30 DIAGNOSIS — E1159 Type 2 diabetes mellitus with other circulatory complications: Secondary | ICD-10-CM | POA: Diagnosis not present

## 2023-06-30 DIAGNOSIS — K2100 Gastro-esophageal reflux disease with esophagitis, without bleeding: Secondary | ICD-10-CM

## 2023-06-30 DIAGNOSIS — Z7984 Long term (current) use of oral hypoglycemic drugs: Secondary | ICD-10-CM

## 2023-06-30 DIAGNOSIS — E1142 Type 2 diabetes mellitus with diabetic polyneuropathy: Secondary | ICD-10-CM | POA: Diagnosis not present

## 2023-06-30 DIAGNOSIS — F411 Generalized anxiety disorder: Secondary | ICD-10-CM

## 2023-06-30 DIAGNOSIS — I152 Hypertension secondary to endocrine disorders: Secondary | ICD-10-CM

## 2023-06-30 DIAGNOSIS — R195 Other fecal abnormalities: Secondary | ICD-10-CM | POA: Insufficient documentation

## 2023-06-30 LAB — POCT GLYCOSYLATED HEMOGLOBIN (HGB A1C): Hemoglobin A1C: 6.4 % — AB (ref 4.0–5.6)

## 2023-06-30 MED ORDER — TRAMADOL HCL 50 MG PO TABS: ORAL_TABLET | ORAL | 0 refills | Status: DC

## 2023-06-30 MED ORDER — DICYCLOMINE HCL 10 MG PO CAPS: ORAL_CAPSULE | ORAL | 0 refills | Status: DC

## 2023-06-30 MED ORDER — RIFAXIMIN 550 MG PO TABS: 550.0000 mg | ORAL_TABLET | Freq: Three times a day (TID) | ORAL | 0 refills | Status: DC

## 2023-06-30 MED ORDER — ATENOLOL 25 MG PO TABS: 25.0000 mg | ORAL_TABLET | Freq: Every day | ORAL | 1 refills | Status: DC

## 2023-06-30 MED ORDER — DICLOFENAC SODIUM 1 % EX GEL: 4.0000 g | Freq: Four times a day (QID) | CUTANEOUS | 6 refills | Status: AC

## 2023-06-30 MED ORDER — PANTOPRAZOLE SODIUM 40 MG PO TBEC: 40.0000 mg | DELAYED_RELEASE_TABLET | Freq: Every day | ORAL | 3 refills | Status: DC

## 2023-06-30 NOTE — Patient Instructions (Signed)
 Will make cardiology referral.  Bentyl 3 times a day.  Xifaxan for diarrhea.  Tramadol for pain.

## 2023-06-30 NOTE — Progress Notes (Signed)
 Established Patient Office Visit  Subjective   Patient ID: Benjamin Ramirez, male    DOB: 01-06-1935  Age: 89 y.o. MRN: [id number]  Chief Complaint  Patient presents with   Medical Management of Chronic Issues    T2DM last A1C last 6.4    HPI Patient is an 89 yo male with T2DM, peripheral neuropathy, and osteoarthritis who presents for follow up.   His last A1C was 6.4% on 03/30/23. Patient reports he has been trying to cut down on carbs. He is not very active. He denies any CP or SOB. He is not checking his sugars at home. He denies any hypoglycemic events. He is taking glipizide 10mg  only with meals.    He has been taking Lasix PRN for leg swelling.  He has been taking Atenolol PRN for HR > 120bpm per his cardiologist for his irregularly irregular heart rhythm.   Both knees/low back still in pain and he is still taking Tramadol 50mg  for pain. He feels like this helps most of the time. He continues to use Voltaren gel multiple times daily and it seems to help him.   He's been going through 5 boxes of Imodium - living in the restroom per his daughter. He was given Bentyl 10mg  capsule that did not help. He states it has gotten to the point of fecal incontinence. He endorses abdominal pain that is relieved by defecation.    Review of Systems  Constitutional:  Negative for chills, fever and weight loss.  Gastrointestinal:  Positive for abdominal pain, diarrhea and nausea. Negative for constipation and vomiting.  Musculoskeletal:  Positive for joint pain.       Bilateral knee pain  All other systems reviewed and are negative.  Objective:    BP (!) 115/53 (BP Location: Left Arm, Patient Position: Sitting, Cuff Size: Normal)   Pulse 60   Ht 5\' 4"  (1.626 m)   Wt 169 lb 4 oz (76.8 kg)   SpO2 95%   BMI 29.05 kg/m  BP Readings from Last 3 Encounters:  06/30/23 (!) 115/53  05/26/23 127/67  04/21/23 134/78   Wt Readings from Last 3 Encounters:  06/30/23 169 lb 4 oz (76.8 kg)   03/30/23 163 lb 8 oz (74.2 kg)  11/12/22 164 lb 12 oz (74.7 kg)   SpO2 Readings from Last 3 Encounters:  06/30/23 95%  04/21/23 99%  03/30/23 99%   .Marland KitchenPhysical Exam Constitutional:      Appearance: Normal appearance.  HENT:     Head: Normocephalic and atraumatic.  Eyes:     Extraocular Movements: Extraocular movements intact.  Cardiovascular:     Rate and Rhythm: Normal rate. Rhythm irregular.     Pulses: Normal pulses.     Heart sounds: Normal heart sounds.  Pulmonary:     Effort: Pulmonary effort is normal.     Breath sounds: Normal breath sounds.  Musculoskeletal:        General: Swelling present.     Comments: Bilateral knee swelling  Skin:    General: Skin is warm.  Neurological:     Mental Status: He is alert.  Psychiatric:        Mood and Affect: Mood normal.     Last hemoglobin A1c Lab Results  Component Value Date   HGBA1C 6.4 (A) 06/30/2023   Assessment & Plan:  Marland KitchenMarland KitchenLincoln was seen today for medical management of chronic issues.  Diagnoses and all orders for this visit:  Controlled type 2 diabetes mellitus with diabetic polyneuropathy, without  long-term current use of insulin (HCC) -     POCT HgB A1C  Chronic pain of both knees -     diclofenac Sodium (VOLTAREN) 1 % GEL; Apply 4 g topically 4 (four) times daily. To affected joint. -     traMADol (ULTRAM) 50 MG tablet; Take 1-2 tablets every 8 hours as needed for moderate pain. Max 6 tablets daily. -     traMADol (ULTRAM) 50 MG tablet; Take 1-2 tablets every 8 hours as needed for moderate pain. Max 6 tablets daily. -     traMADol (ULTRAM) 50 MG tablet; Take 1-2 tablets every 8 hours as needed for moderate pain. Max 6 tablets daily.  GAD (generalized anxiety disorder)  Hypertension associated with diabetes (HCC)  Need for shingles vaccine  Peripheral polyneuropathy  Irritable bowel syndrome with diarrhea -     rifaximin (XIFAXAN) 550 MG TABS tablet; Take 1 tablet (550 mg total) by mouth 3 (three)  times daily. -     dicyclomine (BENTYL) 10 MG capsule; TAKE 1 CAPSULE BY MOUTH three times a day before meals.  Regularly irregular pulse rhythym -     atenolol (TENORMIN) 25 MG tablet; Take 1 tablet (25 mg total) by mouth daily.  Loose stools  Gastroesophageal reflux disease with esophagitis without hemorrhage -     pantoprazole (PROTONIX) 40 MG tablet; Take 1 tablet (40 mg total) by mouth daily.   Due to DM eye exam Due for Shingles vaccine will need to get at pharmacy HgB A1C goal < 6% A1C today: 6.4 Fasting labs ordered today  CMP+ eGFR to check kidney function Referral to orthopedics for joint injections  Continue Tramadol for joint pain  .PDMP reviewed during this encounter.  Continue Voltaren gel for joint pain Diarrhea  Start Xifaxan 550mg  tablets for 14 days Educated patient on resetting his bowel  Emphasized fiber intake Increase dicyclomine 10mg  TID prior to meals  Reviewed last echocardiogram with the patient  Discussed that his ejection fraction is normal Refill Atenolol 25mg  tablet   Tandy Gaw, PA-C

## 2023-07-07 ENCOUNTER — Encounter: Payer: Self-pay | Admitting: Physician Assistant

## 2023-07-14 ENCOUNTER — Other Ambulatory Visit: Payer: Self-pay | Admitting: Physician Assistant

## 2023-07-14 DIAGNOSIS — F411 Generalized anxiety disorder: Secondary | ICD-10-CM

## 2023-07-14 NOTE — Telephone Encounter (Signed)
..  PDMP reviewed during this encounter. Sent refills.

## 2023-09-26 ENCOUNTER — Other Ambulatory Visit: Payer: Self-pay | Admitting: Physician Assistant

## 2023-09-26 DIAGNOSIS — G8929 Other chronic pain: Secondary | ICD-10-CM

## 2023-09-29 ENCOUNTER — Ambulatory Visit: Payer: Medicare Other | Admitting: Physician Assistant

## 2023-09-30 NOTE — Telephone Encounter (Signed)
 Routing to the covering provider  Last OV: 06/30/23 Next OV: 10/02/23 Last RF: 08/29/23

## 2023-10-01 ENCOUNTER — Other Ambulatory Visit: Payer: Self-pay | Admitting: Physician Assistant

## 2023-10-01 DIAGNOSIS — F411 Generalized anxiety disorder: Secondary | ICD-10-CM

## 2023-10-02 ENCOUNTER — Encounter: Payer: Self-pay | Admitting: Physician Assistant

## 2023-10-02 ENCOUNTER — Ambulatory Visit (INDEPENDENT_AMBULATORY_CARE_PROVIDER_SITE_OTHER): Admitting: Physician Assistant

## 2023-10-02 VITALS — BP 102/57 | HR 120 | Ht 64.0 in | Wt 167.0 lb

## 2023-10-02 DIAGNOSIS — E1159 Type 2 diabetes mellitus with other circulatory complications: Secondary | ICD-10-CM

## 2023-10-02 DIAGNOSIS — I152 Hypertension secondary to endocrine disorders: Secondary | ICD-10-CM

## 2023-10-02 DIAGNOSIS — K580 Irritable bowel syndrome with diarrhea: Secondary | ICD-10-CM | POA: Diagnosis not present

## 2023-10-02 DIAGNOSIS — M25562 Pain in left knee: Secondary | ICD-10-CM

## 2023-10-02 DIAGNOSIS — G8929 Other chronic pain: Secondary | ICD-10-CM

## 2023-10-02 DIAGNOSIS — E1142 Type 2 diabetes mellitus with diabetic polyneuropathy: Secondary | ICD-10-CM | POA: Diagnosis not present

## 2023-10-02 DIAGNOSIS — M25561 Pain in right knee: Secondary | ICD-10-CM

## 2023-10-02 DIAGNOSIS — I499 Cardiac arrhythmia, unspecified: Secondary | ICD-10-CM | POA: Diagnosis not present

## 2023-10-02 DIAGNOSIS — N1831 Chronic kidney disease, stage 3a: Secondary | ICD-10-CM | POA: Diagnosis not present

## 2023-10-02 DIAGNOSIS — F411 Generalized anxiety disorder: Secondary | ICD-10-CM | POA: Diagnosis not present

## 2023-10-02 DIAGNOSIS — Z7984 Long term (current) use of oral hypoglycemic drugs: Secondary | ICD-10-CM | POA: Diagnosis not present

## 2023-10-02 DIAGNOSIS — G894 Chronic pain syndrome: Secondary | ICD-10-CM

## 2023-10-02 DIAGNOSIS — R195 Other fecal abnormalities: Secondary | ICD-10-CM | POA: Diagnosis not present

## 2023-10-02 LAB — POCT GLYCOSYLATED HEMOGLOBIN (HGB A1C): Hemoglobin A1C: 6.8 % — AB (ref 4.0–5.6)

## 2023-10-02 MED ORDER — HYDROCHLOROTHIAZIDE 12.5 MG PO TABS
12.5000 mg | ORAL_TABLET | Freq: Every day | ORAL | 2 refills | Status: AC
Start: 2023-10-02 — End: ?

## 2023-10-02 MED ORDER — TRAMADOL HCL 50 MG PO TABS: ORAL_TABLET | ORAL | 0 refills | Status: DC

## 2023-10-02 MED ORDER — DAPAGLIFLOZIN PROPANEDIOL 10 MG PO TABS: 10.0000 mg | ORAL_TABLET | Freq: Every day | ORAL | 0 refills | Status: DC

## 2023-10-02 NOTE — Patient Instructions (Addendum)
 Will make referral for GI appt.  Collect stool sample.  Start farxiga  in combination with glipizide .

## 2023-10-02 NOTE — Progress Notes (Signed)
 Established Patient Office Visit  Subjective   Patient ID: Benjamin Ramirez, male    DOB: [birth year]  Age: 89 y.o. MRN: [id number]  Chief Complaint  Patient presents with   Medical Management of Chronic Issues    Last A1c 6.4    HPI Pt is a 89 yo male with T2DM, peripheral neuropathy, and osteoarthritis who presents to the clinic for follow up. He is accompanied by his caregiver and live in friend.   Caregiver is concerned about his sugars rising. At times he has had sugars over 300. He is taking glipizide  twice a day.   Diarrhea continues but seems not to be quite as bad. Never took xifaxan . Reporting taking bentyl  3 times a day.   Pt on tramadol  for chronic pain. He is doing well with no concerns.    .. Active Ambulatory Problems    Diagnosis Date Noted   Chronic sinusitis 10/06/2011   Erectile dysfunction 10/06/2011   GERD (gastroesophageal reflux disease) 10/06/2011   Hiatal hernia 10/06/2011   Spinal stenosis 05/29/2017   Spondylitis (HCC) 10/06/2011   History of myocardial infarction    Hypertension associated with diabetes (HCC) 05/29/2017   Insomnia secondary to chronic pain 05/29/2017   Encounter for chronic pain management 05/29/2017   Neurogenic claudication due to lumbar spinal stenosis 05/29/2017   Umbilical hernia without obstruction or gangrene    Fatty liver    Hearing difficulty of both ears 06/05/2017   Peripheral polyneuropathy 06/05/2017   Aortic atherosclerosis (HCC) 06/05/2017   Venous stasis dermatitis of both lower extremities 06/05/2017   Muscle atrophy of lower extremity 06/05/2017   Onychomycosis due to dermatophyte 06/05/2017   History of echocardiogram 06/05/2017   Cardiomegaly 06/05/2017   Mild mitral regurgitation 06/11/2017   CKD (chronic kidney disease) stage 3, GFR 30-59 ml/min (HCC) 06/22/2019   Hyperlipidemia LDL goal <70 06/22/2019   Chronic pain of both knees 06/22/2019   Multilevel degenerative disc disease 06/22/2019   Controlled  type 2 diabetes mellitus with diabetic polyneuropathy, without long-term current use of insulin (HCC) 06/22/2019   Primary osteoarthritis of both knees 06/27/2019   Irregular heart rhythm 01/03/2020   GAD (generalized anxiety disorder) 06/12/2020   Bilateral lower extremity edema 03/27/2021   Dizziness 03/27/2021   Primary osteoarthritis 04/29/2022   Irritable bowel syndrome with diarrhea 04/29/2022   Loose stools 06/30/2023   Regularly irregular pulse rhythym 06/30/2023   Resolved Ambulatory Problems    Diagnosis Date Noted   Diabetes mellitus with neurological manifestations, controlled (HCC) 07/26/2014   Myocardial infarction (HCC) 10/06/2011   Osteoarthritis 10/06/2011   S/P ERCP 09/22/2011   Seasonal allergies 10/06/2011   Therapeutic opioid induced constipation 05/29/2017   Diet-controlled diabetes mellitus (HCC) 06/05/2017   Pain management contract signed 06/05/2017   Elevated serum creatinine 02/16/2018   Acute cystitis with hematuria 02/16/2018   Anxiety 06/22/2019   Sinus arrhythmia seen on electrocardiogram 01/05/2020   Uncontrolled type 2 diabetes mellitus with hyperglycemia (HCC) 04/02/2020   Regularly irregular pulse rhythym 03/27/2021   Past Medical History:  Diagnosis Date   Allergy    Atherosclerosis of aorta (HCC)    CAD (coronary artery disease)    Chronic back pain    Depression    Diabetes mellitus without complication (HCC)    Hypertension        ROS See HPI.    Objective:     BP (!) 102/57   Pulse (!) 120   Ht 5' 4 (1.626 m)   Wt 167  lb (75.8 kg)   SpO2 99%   BMI 28.67 kg/m  BP Readings from Last 3 Encounters:  10/02/23 (!) 102/57  06/30/23 (!) 115/53  05/26/23 127/67   Wt Readings from Last 3 Encounters:  10/02/23 167 lb (75.8 kg)  06/30/23 169 lb 4 oz (76.8 kg)  03/30/23 163 lb 8 oz (74.2 kg)   .Benjamin Ramirez Lab Results  Component Value Date   HGBA1C 6.8 (A) 10/02/2023   HGBA1C 6.4 (A) 06/30/2023   HGBA1C 6.4 (H) 03/30/2023        Physical Exam Constitutional:      Appearance: Normal appearance.  HENT:     Head: Normocephalic.  Cardiovascular:     Rate and Rhythm: Rhythm irregular.     Comments: Irregular regular rhythm Pulmonary:     Effort: Pulmonary effort is normal.     Breath sounds: Normal breath sounds.  Musculoskeletal:     Right lower leg: No edema.     Left lower leg: No edema.  Neurological:     General: No focal deficit present.     Mental Status: He is alert and oriented to person, place, and time.  Psychiatric:        Mood and Affect: Mood normal.        Assessment & Plan:  Benjamin AasAaron AasOrland was seen today for medical management of chronic issues.  Diagnoses and all orders for this visit:  Chronic pain of both knees -     traMADol  (ULTRAM ) 50 MG tablet; Take 1-2 tablets every 8 hours as needed for moderate pain. Max 6 tablets daily. -     traMADol  (ULTRAM ) 50 MG tablet; Take 1-2 tablets every 8 hours as needed for moderate pain. Max 6 tablets daily. -     traMADol  (ULTRAM ) 50 MG tablet; Take 1-2 tablets every 8 hours as needed for moderate pain. Max 6 tablets daily. -     Drug Profile, Ur, 9 Drugs  Controlled type 2 diabetes mellitus with diabetic polyneuropathy, without long-term current use of insulin (HCC) -     POCT HgB A1C -     dapagliflozin  propanediol (FARXIGA ) 10 MG TABS tablet; Take 1 tablet (10 mg total) by mouth daily. -     CMP14+EGFR  GAD (generalized anxiety disorder)  Hypertension associated with diabetes (HCC) -     hydrochlorothiazide  (HYDRODIURIL ) 12.5 MG tablet; Take 1 tablet (12.5 mg total) by mouth daily.  Regularly irregular pulse rhythym -     hydrochlorothiazide  (HYDRODIURIL ) 12.5 MG tablet; Take 1 tablet (12.5 mg total) by mouth daily.  Loose stools -     Cdiff NAA+O+P+Stool Culture  Irritable bowel syndrome with diarrhea -     Cdiff NAA+O+P+Stool Culture  Stage 3a chronic kidney disease (HCC) -     dapagliflozin  propanediol (FARXIGA ) 10 MG TABS  tablet; Take 1 tablet (10 mg total) by mouth daily. -     CMP14+EGFR   .Benjamin AasPDMP reviewed during this encounter. Pain contract signed today No concerns Refilled tramadol   A1C to goal but increased a little over last 3 months Added farxiga  to glipizide  Discussed DM diet BP to goal Cmp ordered On statin Needs eye exam Foot exam UTD Declined vaccines today  Stool culture ordered today for diarrhea since still having some   Return in about 3 months (around 01/02/2024).    Bates Collington, PA-C

## 2023-10-03 LAB — CMP14+EGFR
ALT: 16 IU/L (ref 0–44)
AST: 34 IU/L (ref 0–40)
Albumin: 4.4 g/dL (ref 3.7–4.7)
Alkaline Phosphatase: 68 IU/L (ref 44–121)
BUN/Creatinine Ratio: 17 (ref 10–24)
BUN: 26 mg/dL (ref 8–27)
Bilirubin Total: 0.6 mg/dL (ref 0.0–1.2)
CO2: 20 mmol/L (ref 20–29)
Calcium: 9.5 mg/dL (ref 8.6–10.2)
Chloride: 101 mmol/L (ref 96–106)
Creatinine, Ser: 1.51 mg/dL — ABNORMAL HIGH (ref 0.76–1.27)
Globulin, Total: 2.8 g/dL (ref 1.5–4.5)
Glucose: 130 mg/dL — ABNORMAL HIGH (ref 70–99)
Sodium: 140 mmol/L (ref 134–144)
Total Protein: 7.2 g/dL (ref 6.0–8.5)
eGFR: 45 mL/min/{1.73_m2} — ABNORMAL LOW (ref >59)

## 2023-10-05 ENCOUNTER — Ambulatory Visit: Payer: Self-pay | Admitting: Physician Assistant

## 2023-10-05 MED ORDER — DICYCLOMINE HCL 10 MG PO CAPS: ORAL_CAPSULE | ORAL | 1 refills | Status: DC

## 2023-10-05 NOTE — Progress Notes (Signed)
 Random glucose elevated some. Kidney function down some. Need to drink more water. Hopefully the new farxiga  will help prevent any further kidney function decline.

## 2023-10-05 NOTE — Telephone Encounter (Signed)
 Last OV: 10/02/23 Next OV: 01/01/24 Last RF: 07/16/23

## 2023-10-06 LAB — DRUG PROFILE 799031
BENZODIAZEPINES: POSITIVE — AB
HYDROXYALPRAZOLAM: POSITIVE — AB
NORDIAZEPAM: NEGATIVE
OH-Alprazolam GC/MS Conf: 416 ng/mL
Oxazepam: NEGATIVE

## 2023-10-06 LAB — DRUG PROFILE, UR, 9 DRUGS (LABCORP)
Amphetamines, Urine: NEGATIVE ng/mL
Barbiturate Quant, Ur: NEGATIVE ng/mL
Cannabinoid Quant, Ur: NEGATIVE ng/mL
Cocaine (Metab.): NEGATIVE ng/mL
Creatinine, Urine: 216.9 mg/dL (ref 20.0–300.0)
Methadone Screen, Urine: NEGATIVE ng/mL
Nitrite Urine, Quantitative: NEGATIVE ug/mL
OPIATE SCREEN URINE: NEGATIVE ng/mL
PCP Quant, Ur: NEGATIVE ng/mL
Propoxyphene: NEGATIVE ng/mL
pH, Urine: 5 (ref 4.5–8.9)

## 2023-10-30 ENCOUNTER — Other Ambulatory Visit: Payer: Self-pay | Admitting: Physician Assistant

## 2023-10-30 DIAGNOSIS — G8929 Other chronic pain: Secondary | ICD-10-CM

## 2023-10-30 NOTE — Telephone Encounter (Unsigned)
 Copied from CRM [id number]. Topic: Clinical - Medication Refill >> Oct 30, 2023  9:41 AM Kevelyn M wrote: Medication: traMADol  (ULTRAM ) 50 MG tablet  Has the patient contacted their pharmacy? Yes (Agent: If no, request that the patient contact the pharmacy for the refill. If patient does not wish to contact the pharmacy document the reason why and proceed with request.) (Agent: If yes, when and what did the pharmacy advise?)  This is the patient's preferred pharmacy:  [id number] - [place], [place] - [address] [facility] [address], [place] [address] Phone: [phone]  Fax: [phone]  Is this the correct pharmacy for this prescription? Yes If no, delete pharmacy and type the correct one.   Has the prescription been filled recently? No  Is the patient out of the medication? Yes  Has the patient been seen for an appointment in the last year OR does the patient have an upcoming appointment? Yes  Can we respond through MyChart? No  Agent: Please be advised that Rx refills may take up to 3 business days. We ask that you follow-up with your pharmacy.

## 2023-11-02 NOTE — Telephone Encounter (Signed)
 Tramadol  already sent to pharmacy.

## 2023-11-09 ENCOUNTER — Other Ambulatory Visit: Payer: Self-pay | Admitting: Physician Assistant

## 2023-11-09 DIAGNOSIS — I499 Cardiac arrhythmia, unspecified: Secondary | ICD-10-CM

## 2023-11-09 NOTE — Telephone Encounter (Signed)
 The Tramadol  was sent to pharmacy. The call was to inform patient.

## 2023-11-09 NOTE — Telephone Encounter (Signed)
 Copied from CRM [id number]. Topic: General - Other >> Nov 05, 2023  9:23 AM Miquel SAILOR wrote: Reason for CRM: Patient Benjamin Ramirez returned office call. No notes Needs call back for her or PT [phone]

## 2023-12-11 ENCOUNTER — Other Ambulatory Visit: Payer: Self-pay

## 2023-12-11 DIAGNOSIS — N1831 Chronic kidney disease, stage 3a: Secondary | ICD-10-CM

## 2023-12-11 DIAGNOSIS — E1142 Type 2 diabetes mellitus with diabetic polyneuropathy: Secondary | ICD-10-CM

## 2023-12-11 MED ORDER — GLIPIZIDE 10 MG PO TABS: 10.0000 mg | ORAL_TABLET | Freq: Two times a day (BID) | ORAL | 0 refills | Status: DC

## 2023-12-11 MED ORDER — DAPAGLIFLOZIN PROPANEDIOL 10 MG PO TABS: 10.0000 mg | ORAL_TABLET | Freq: Every day | ORAL | 0 refills | Status: DC

## 2023-12-11 NOTE — Progress Notes (Signed)
 Pt called into office and said he had no more diabetes medication. Medication sent to local [facility] pharmacy. Keep scheduled follow ups.   [name].Diagnoses and all orders for this visit:  Controlled type 2 diabetes mellitus with diabetic polyneuropathy, without long-term current use of insulin (HCC) -     dapagliflozin  propanediol (FARXIGA ) 10 MG TABS tablet; Take 1 tablet (10 mg total) by mouth daily. -     glipiZIDE  (GLUCOTROL ) 10 MG tablet; Take 1 tablet (10 mg total) by mouth 2 (two) times daily before a meal.  Stage 3a chronic kidney disease (HCC) -     dapagliflozin  propanediol (FARXIGA ) 10 MG TABS tablet; Take 1 tablet (10 mg total) by mouth daily.

## 2023-12-24 ENCOUNTER — Other Ambulatory Visit: Payer: Self-pay | Admitting: Physician Assistant

## 2023-12-24 DIAGNOSIS — F411 Generalized anxiety disorder: Secondary | ICD-10-CM

## 2023-12-29 ENCOUNTER — Other Ambulatory Visit: Payer: Self-pay | Admitting: Physician Assistant

## 2023-12-29 DIAGNOSIS — G8929 Other chronic pain: Secondary | ICD-10-CM

## 2023-12-29 NOTE — Telephone Encounter (Unsigned)
 Copied from CRM [id number]. Topic: Clinical - Medication Refill >> Dec 29, 2023  3:19 PM Diannia H wrote: Medication: traMADol  (ULTRAM ) 50 MG tablet  Has the patient contacted their pharmacy? Yes (Agent: If no, request that the patient contact the pharmacy for the refill. If patient does not wish to contact the pharmacy document the reason why and proceed with request.) (Agent: If yes, when and what did the pharmacy advise?)  This is the patient's preferred pharmacy:  [facility] #[id number] - [place], [place] - [address] [facility] [address] [place] [address] Phone: [phone] Fax: [phone]  Is this the correct pharmacy for this prescription? Yes If no, delete pharmacy and type the correct one.   Has the prescription been filled recently? Yes  Is the patient out of the medication? Yes  Has the patient been seen for an appointment in the last year OR does the patient have an upcoming appointment? Yes  Can we respond through MyChart? Yes  Agent: Please be advised that Rx refills may take up to 3 business days. We ask that you follow-up with your pharmacy.

## 2023-12-31 ENCOUNTER — Telehealth: Payer: Self-pay

## 2023-12-31 DIAGNOSIS — G8929 Other chronic pain: Secondary | ICD-10-CM

## 2023-12-31 NOTE — Telephone Encounter (Signed)
 Requesting rx rf of Tramadol  50mg   Last written 07/29/225 Last OV 10/02/2023 Upcoming appt 01/01/2024 at 2:20pm

## 2023-12-31 NOTE — Telephone Encounter (Signed)
 Copied from CRM [id number]. Topic: Clinical - Medication Refill >> Dec 29, 2023  3:19 PM Shamecia H wrote: Medication: traMADol  (ULTRAM ) 50 MG tablet  Has the patient contacted their pharmacy? Yes (Agent: If no, request that the patient contact the pharmacy for the refill. If patient does not wish to contact the pharmacy document the reason why and proceed with request.) (Agent: If yes, when and what did the pharmacy advise?)  This is the patient's preferred pharmacy:  [facility] #[id number] - [place], [place] - [address] [facility] [address] [place] [address] Phone: [phone] Fax: [phone]  Is this the correct pharmacy for this prescription? Yes If no, delete pharmacy and type the correct one.   Has the prescription been filled recently? Yes  Is the patient out of the medication? Yes  Has the patient been seen for an appointment in the last year OR does the patient have an upcoming appointment? Yes  Can we respond through MyChart? Yes  Agent: Please be advised that Rx refills may take up to 3 business days. We ask that you follow-up with your pharmacy. >> Dec 31, 2023  3:34 PM Miquel SAILOR wrote: Medication: traMADol  (ULTRAM ) 50 MG tablet Patient sister Maeola calling on update for medication. Let her kow since it was put in on 08/26 it takes up to 3 days for the medication for refill. Will wait for refill.

## 2024-01-01 ENCOUNTER — Ambulatory Visit (INDEPENDENT_AMBULATORY_CARE_PROVIDER_SITE_OTHER): Admitting: Physician Assistant

## 2024-01-01 ENCOUNTER — Encounter: Payer: Self-pay | Admitting: Physician Assistant

## 2024-01-01 VITALS — BP 126/64 | HR 110 | Temp 96.0°F | Ht 64.0 in | Wt 165.0 lb

## 2024-01-01 DIAGNOSIS — E1142 Type 2 diabetes mellitus with diabetic polyneuropathy: Secondary | ICD-10-CM

## 2024-01-01 DIAGNOSIS — Z7984 Long term (current) use of oral hypoglycemic drugs: Secondary | ICD-10-CM

## 2024-01-01 DIAGNOSIS — F411 Generalized anxiety disorder: Secondary | ICD-10-CM | POA: Diagnosis not present

## 2024-01-01 DIAGNOSIS — G629 Polyneuropathy, unspecified: Secondary | ICD-10-CM

## 2024-01-01 DIAGNOSIS — K529 Noninfective gastroenteritis and colitis, unspecified: Secondary | ICD-10-CM | POA: Diagnosis not present

## 2024-01-01 DIAGNOSIS — I499 Cardiac arrhythmia, unspecified: Secondary | ICD-10-CM | POA: Diagnosis not present

## 2024-01-01 DIAGNOSIS — G4700 Insomnia, unspecified: Secondary | ICD-10-CM

## 2024-01-01 DIAGNOSIS — G894 Chronic pain syndrome: Secondary | ICD-10-CM

## 2024-01-01 DIAGNOSIS — N1831 Chronic kidney disease, stage 3a: Secondary | ICD-10-CM

## 2024-01-01 LAB — POCT GLYCOSYLATED HEMOGLOBIN (HGB A1C): Hemoglobin A1C: 6.2 % — AB (ref 4.0–5.6)

## 2024-01-01 MED ORDER — GABAPENTIN 800 MG PO TABS: 800.0000 mg | ORAL_TABLET | Freq: Three times a day (TID) | ORAL | 3 refills | Status: DC

## 2024-01-01 MED ORDER — TRAMADOL HCL 50 MG PO TABS: ORAL_TABLET | ORAL | 0 refills | Status: DC

## 2024-01-01 MED ORDER — SITAGLIPTIN PHOSPHATE 100 MG PO TABS: 100.0000 mg | ORAL_TABLET | Freq: Every day | ORAL | 0 refills | Status: DC

## 2024-01-01 MED ORDER — CREON 3000-9500 UNITS PO CPEP: ORAL_CAPSULE | ORAL | 0 refills | Status: AC

## 2024-01-01 MED ORDER — DOXEPIN HCL 10 MG PO CAPS: 10.0000 mg | ORAL_CAPSULE | Freq: Every day | ORAL | 0 refills | Status: DC

## 2024-01-01 NOTE — Patient Instructions (Addendum)
 Start januvia  with farxiga  and glipizide  for sugar control.  Increased gabapentin  to 800mg  three times day.  Start doxepin  at bedtime for sleep.  Take creon  two tablets with meals up to three times a day.

## 2024-01-01 NOTE — Telephone Encounter (Signed)
 Spoke with Benjamin Ramirez ( on DPR) informed that a prescription was sent in  today - patient is on route to the office at the time of the call.

## 2024-01-05 ENCOUNTER — Encounter: Payer: Self-pay | Admitting: Physician Assistant

## 2024-01-05 NOTE — Progress Notes (Signed)
 Established Patient Office Visit  Subjective   Patient ID: Benjamin Ramirez, male    DOB: 1935-02-17  Age: 89 y.o. MRN: [id number]  Chief Complaint  Patient presents with   Medical Management of Chronic Issues    HPI Pt is a 89 yo male who presents to the clinic with his caregiver.   Pt is taking tramadol  three times a day for pain. Continues to have knee pain that effects him walking and getting around.   Caregiver is concerned about sugars elevated after he eats. She has been increasing his glipizide  at meal time. She has gotten 180 after he eats before. He denies any hypoglycemic symptoms. She uses a fingerstick device. Morning sugars are around 120. He is taking glipizide  and farxiga .   Pt has used some of his sister's creon  and helped with his chronic diarrhea and would like to try his on.   Pt and caregiver discuss anxiety. He feels anxious during the day and would like to get increase in klonapin. He is having trouble sleeping as well. He did not like remeron  and/or trazodone.    ROS See HPI.    Objective:     BP 126/64   Pulse (!) 110   Temp (!) 96 F (35.6 C)   Ht 5' 4 (1.626 m)   Wt 165 lb (74.8 kg)   SpO2 99%   BMI 28.32 kg/m  BP Readings from Last 3 Encounters:  01/01/24 126/64  10/02/23 (!) 102/57  06/30/23 (!) 115/53   Wt Readings from Last 3 Encounters:  01/01/24 165 lb (74.8 kg)  10/02/23 167 lb (75.8 kg)  06/30/23 169 lb 4 oz (76.8 kg)      Physical Exam Constitutional:      Appearance: Normal appearance.  HENT:     Head: Normocephalic.  Cardiovascular:     Rate and Rhythm: Tachycardia present. Rhythm irregular.  Pulmonary:     Effort: Pulmonary effort is normal.  Musculoskeletal:     Cervical back: Normal range of motion and neck supple.     Right lower leg: No edema.     Left lower leg: No edema.  Neurological:     General: No focal deficit present.     Mental Status: He is alert and oriented to person, place, and time.  Psychiatric:         Mood and Affect: Mood normal.      Results for orders placed or performed in visit on 01/01/24  POCT HgB A1C  Result Value Ref Range   Hemoglobin A1C 6.2 (A) 4.0 - 5.6 %   HbA1c POC (<> result, manual entry)     HbA1c, POC (prediabetic range)     HbA1c, POC (controlled diabetic range)        Assessment & Plan:  [name] was seen today for medical management of chronic issues.  Diagnoses and all orders for this visit:  Controlled type 2 diabetes mellitus with diabetic polyneuropathy, without long-term current use of insulin (HCC) -     POCT HgB A1C -     sitaGLIPtin  (JANUVIA ) 100 MG tablet; Take 1 tablet (100 mg total) by mouth daily.  Regularly irregular pulse rhythym  Chronic pain syndrome -     gabapentin  (NEURONTIN ) 800 MG tablet; Take 1 tablet (800 mg total) by mouth 3 (three) times daily.  Stage 3a chronic kidney disease (HCC)  Peripheral polyneuropathy -     gabapentin  (NEURONTIN ) 800 MG tablet; Take 1 tablet (800 mg total) by mouth 3 (  three) times daily.  GAD (generalized anxiety disorder) -     gabapentin  (NEURONTIN ) 800 MG tablet; Take 1 tablet (800 mg total) by mouth 3 (three) times daily.  Insomnia, unspecified type -     doxepin  (SINEQUAN ) 10 MG capsule; Take 1 capsule (10 mg total) by mouth at bedtime.  Chronic diarrhea -     Pancrelipase , Lip-Prot-Amyl, (CREON ) 3000-9500 units CPEP; Take two tablets with meals   A1C to goal Continue on glipizide  and farxiga  but do not take more than prescribed Added januvia  Discussed to only check sugars 2 hours after eating and fasting in morning unless feeling bad or concerning BP to goal On SGLT-2 and ACE for kidney protection Needs eye exam Needs foot exam(pt declined today)  Pt declined flu/pneumonia/covid vaccine On statin  Continue tramadol  for pain, refill sent earlier .SABRAPDMP reviewed during this encounter. Pain contract UTD Increased gabapentin  to 800mg  TID, could help with anxiety as well Follow  up in 3 months  Chronic diarrhea Trial of creon  TID  Discussed anxiety Not going to increase klonapin, discussed risk of sudden death increase with taking tramadol  and klonapin Trial of doxepin  for sleep which could help with anxiety Increased gabapentin  to 800mg  TID Failed buspar /SSRI/SSNRI      Return in about 3 months (around 04/02/2024).    Tylor Courtwright, PA-C

## 2024-02-18 ENCOUNTER — Telehealth: Payer: Self-pay | Admitting: Physician Assistant

## 2024-02-18 ENCOUNTER — Ambulatory Visit: Payer: Self-pay

## 2024-02-18 NOTE — Telephone Encounter (Signed)
 The patient is requesting a medication for tapeworms. Please advise, thanks.

## 2024-02-18 NOTE — Telephone Encounter (Signed)
 FYI Only or Action Required?: Action required by provider: clinical question for provider.  Patient was last seen in primary care on 01/01/2024 by Antoniette Vermell CROME, PA-C.  Called Nurse Triage reporting GI Problem.  Symptoms began today.  Interventions attempted: Nothing.  Symptoms are: stable.  Triage Disposition: See Physician Within 24 Hours  Patient/caregiver understands and will follow disposition?: No, wishes to speak with PCP   They are requesting a medicine be called in for this. Said they've never heard of having to have an appt, just sending in meds for this.     Copied from CRM [id number]. Topic: Clinical - Red Word Triage >> Feb 18, 2024  3:12 PM Larissa S wrote: [facility] that prompted transfer to Nurse Triage: Patient states he has worms. Reason for Disposition  Passed a worm by rectum  Answer Assessment - Initial Assessment Questions Pt's friend called said pt just had a bowel movement and found worms. She states that its not pinworms. Pt and family in the background said that it's tapeworm. They don't know where it came. They have not noticed if the dog has any.     1. APPEARANCE of WORM: What does it look like and does it move?     Long, flat white 2. SIZE: How long is the worm?     unsure 3. LOCATION: Where did it come from? (e.g., from rectum, in stool, through mouth)     stool 4. WHEN: When did it happen?     today 5. OTHER SYMPTOMS: Do you have any other symptoms? (e.g., stomach pain, vomiting)     no  7. TRAVEL: Have you traveled internationally in the last 2 to 3 months?     no  Protocols used: Worms - Other Than Pinworms-A-AH

## 2024-02-18 NOTE — Telephone Encounter (Signed)
 Copied from CRM #[id number]. Topic: Clinical - Medication Question >> Feb 18, 2024  1:11 PM Donee H wrote: Reason for CRM: Patient's friend Colene Kleine called on behalf of patient. She is requesting patient be prescribed something for worms. He mentioned to her he has worms and noticed it today.  Please follow up with her [phone]

## 2024-02-18 NOTE — Telephone Encounter (Signed)
Patient triaged in a separate encounter

## 2024-02-19 NOTE — Telephone Encounter (Signed)
 We need to have patient collect ova and parasite stool testing kit to confirm dx.

## 2024-02-19 NOTE — Telephone Encounter (Signed)
 Contacted the patient - requested an appointment with the provider. Patient has been scheduled for 02/22/24 for the current symptoms.

## 2024-02-22 ENCOUNTER — Ambulatory Visit: Admitting: Physician Assistant

## 2024-02-22 VITALS — BP 123/66 | HR 106 | Temp 97.7°F | Ht 64.0 in | Wt 162.0 lb

## 2024-02-22 DIAGNOSIS — B839 Helminthiasis, unspecified: Secondary | ICD-10-CM | POA: Insufficient documentation

## 2024-02-22 DIAGNOSIS — R195 Other fecal abnormalities: Secondary | ICD-10-CM | POA: Diagnosis not present

## 2024-02-22 MED ORDER — ONDANSETRON 8 MG PO TBDP: 8.0000 mg | ORAL_TABLET | Freq: Three times a day (TID) | ORAL | 1 refills | Status: DC | PRN

## 2024-02-22 NOTE — Progress Notes (Deleted)
  error

## 2024-02-22 NOTE — Patient Instructions (Addendum)
 Make appt with cardiology to check in with Dr. Jacqualyn.  Zofran for nausea as needed.  Return stool for testing.

## 2024-02-22 NOTE — Progress Notes (Unsigned)
   Acute Office Visit  Subjective:     Patient ID: Benjamin Ramirez, male    DOB: 1934/10/10, 89 y.o.   MRN: [id number]  Chief Complaint  Patient presents with   Medical Management of Chronic Issues    HPI   ROS      Objective:    BP 123/66   Pulse (!) 106   Temp 97.7 F (36.5 C) (Temporal)   Ht 5' 4 (1.626 m)   Wt 162 lb (73.5 kg)   SpO2 99%   BMI 27.81 kg/m  BP Readings from Last 3 Encounters:  02/22/24 123/66  01/01/24 126/64  10/02/23 (!) 102/57   Wt Readings from Last 3 Encounters:  02/22/24 162 lb (73.5 kg)  01/01/24 165 lb (74.8 kg)  10/02/23 167 lb (75.8 kg)      Physical Exam       Assessment & Plan:  Benjamin Ramirez was seen today for medical management of chronic issues.  Diagnoses and all orders for this visit:  Loose stools -     Ova and parasite examination -     GI Profile, Stool, PCR  Change in stool -     Ova and parasite examination -     GI Profile, Stool, PCR  Worms in stool -     Ova and parasite examination -     GI Profile, Stool, PCR  Other orders -     ondansetron (ZOFRAN-ODT) 8 MG disintegrating tablet; Take 1 tablet (8 mg total) by mouth every 8 (eight) hours as needed.     No follow-ups on file.  Benjamin Hollibaugh, PA-C

## 2024-02-23 ENCOUNTER — Encounter: Payer: Self-pay | Admitting: Physician Assistant

## 2024-02-23 DIAGNOSIS — B839 Helminthiasis, unspecified: Secondary | ICD-10-CM | POA: Diagnosis not present

## 2024-02-23 DIAGNOSIS — R195 Other fecal abnormalities: Secondary | ICD-10-CM | POA: Diagnosis not present

## 2024-02-25 ENCOUNTER — Other Ambulatory Visit: Payer: Self-pay | Admitting: Physician Assistant

## 2024-02-25 DIAGNOSIS — E1142 Type 2 diabetes mellitus with diabetic polyneuropathy: Secondary | ICD-10-CM

## 2024-02-25 DIAGNOSIS — F411 Generalized anxiety disorder: Secondary | ICD-10-CM

## 2024-02-25 DIAGNOSIS — K2100 Gastro-esophageal reflux disease with esophagitis, without bleeding: Secondary | ICD-10-CM

## 2024-02-25 DIAGNOSIS — R351 Nocturia: Secondary | ICD-10-CM

## 2024-02-25 DIAGNOSIS — G629 Polyneuropathy, unspecified: Secondary | ICD-10-CM

## 2024-02-25 DIAGNOSIS — G894 Chronic pain syndrome: Secondary | ICD-10-CM

## 2024-02-25 DIAGNOSIS — I700 Atherosclerosis of aorta: Secondary | ICD-10-CM

## 2024-02-25 LAB — GI PROFILE, STOOL, PCR

## 2024-02-26 ENCOUNTER — Ambulatory Visit: Payer: Self-pay | Admitting: Physician Assistant

## 2024-02-26 NOTE — Progress Notes (Signed)
 You did test positive for e.coli in the stool. Antibiotics are not usually used. It will clear on it's own with hydration and BRAT diet.

## 2024-03-01 LAB — OVA AND PARASITE EXAMINATION

## 2024-03-01 NOTE — Progress Notes (Signed)
 No parasites seen but we could treat with antibiotics if develop a fever or bloody diarrhea.

## 2024-03-08 ENCOUNTER — Other Ambulatory Visit: Payer: Self-pay | Admitting: Physician Assistant

## 2024-03-08 DIAGNOSIS — G4700 Insomnia, unspecified: Secondary | ICD-10-CM

## 2024-03-08 MED ORDER — ALBENDAZOLE 200 MG PO TABS: 400.0000 mg | ORAL_TABLET | Freq: Two times a day (BID) | ORAL | 0 refills | Status: DC

## 2024-03-14 ENCOUNTER — Other Ambulatory Visit: Payer: Self-pay | Admitting: Physician Assistant

## 2024-03-14 DIAGNOSIS — G8929 Other chronic pain: Secondary | ICD-10-CM

## 2024-03-14 MED ORDER — TRAMADOL HCL 50 MG PO TABS: ORAL_TABLET | ORAL | 0 refills | Status: DC

## 2024-03-14 NOTE — Telephone Encounter (Signed)
..  PDMP reviewed during this encounter. No concerns.  UTD with appts Filled today.

## 2024-03-14 NOTE — Telephone Encounter (Signed)
 Copied from CRM [id number]. Topic: Clinical - Medication Refill >> Mar 14, 2024  1:21 PM Joesph B wrote: Medication:  traMADol  (ULTRAM ) 50 MG tablet  Has the patient contacted their pharmacy? Yes (Agent: If no, request that the patient contact the pharmacy for the refill. If patient does not wish to contact the pharmacy document the reason why and proceed with request.) (Agent: If yes, when and what did the pharmacy advise?)  This is the patient's preferred pharmacy:  [id number] - [place], [place] - [address] AT [facility] OF [address] [place] [address] Phone: [phone] Fax: [phone]   Is this the correct pharmacy for this prescription? Yes If no, delete pharmacy and type the correct one.   Has the prescription been filled recently? Yes  Is the patient out of the medication? Yes  Has the patient been seen for an appointment in the last year OR does the patient have an upcoming appointment? Yes  Can we respond through MyChart? No  Agent: Please be advised that Rx refills may take up to 3 business days. We ask that you follow-up with your pharmacy.

## 2024-03-15 ENCOUNTER — Other Ambulatory Visit: Payer: Self-pay | Admitting: Physician Assistant

## 2024-03-15 ENCOUNTER — Telehealth: Payer: Self-pay | Admitting: Physician Assistant

## 2024-03-15 DIAGNOSIS — E1142 Type 2 diabetes mellitus with diabetic polyneuropathy: Secondary | ICD-10-CM

## 2024-03-15 DIAGNOSIS — G8929 Other chronic pain: Secondary | ICD-10-CM

## 2024-03-15 NOTE — Telephone Encounter (Signed)
 Albenadazole is 25 dollars at [facility] using good rx! Please let patient know.

## 2024-03-15 NOTE — Telephone Encounter (Unsigned)
 Copied from CRM [id number]. Topic: Clinical - Medication Question >> Mar 15, 2024 11:36 AM Myrick T wrote: Reason for CRM: [name] called stated the parasite medication that was prescribed to him was $100 and patient can not afford it. She is requesting a more affordable anti-parasite med.

## 2024-03-15 NOTE — Telephone Encounter (Unsigned)
 Copied from CRM #[id number]. Topic: Clinical - Medication Refill >> Mar 15, 2024 11:32 AM Myrick T wrote: Medication: ondansetron (ZOFRAN-ODT) 8 MG disintegrating tablet  Has the patient contacted their pharmacy? Yes No refills and need to contact provider  This is the patient's preferred pharmacy:  [facility] #[id number] - [place],  - [address] AT [facility] OF [address] [place] [address] Phone: [phone] Fax: [phone]  Is this the correct pharmacy for this prescription? Yes  Has the prescription been filled recently? Yes  Is the patient out of the medication? Yes  Has the patient been seen for an appointment in the last year OR does the patient have an upcoming appointment? Yes  Can we respond through MyChart? No  Agent: Please be advised that Rx refills may take up to 3 business days. We ask that you follow-up with your pharmacy.

## 2024-03-16 MED ORDER — ONDANSETRON 8 MG PO TBDP: 8.0000 mg | ORAL_TABLET | Freq: Three times a day (TID) | ORAL | 1 refills | Status: AC | PRN

## 2024-03-16 MED ORDER — TRAMADOL HCL 50 MG PO TABS: ORAL_TABLET | ORAL | 0 refills | Status: AC

## 2024-03-16 MED ORDER — ONDANSETRON 8 MG PO TBDP: 8.0000 mg | ORAL_TABLET | Freq: Three times a day (TID) | ORAL | 1 refills | Status: DC | PRN

## 2024-03-16 MED ORDER — GLIPIZIDE 10 MG PO TABS: 10.0000 mg | ORAL_TABLET | Freq: Two times a day (BID) | ORAL | 0 refills | Status: DC

## 2024-03-16 MED ORDER — GLIPIZIDE 10 MG PO TABS: 10.0000 mg | ORAL_TABLET | Freq: Two times a day (BID) | ORAL | 0 refills | Status: AC

## 2024-03-16 MED ORDER — TRAMADOL HCL 50 MG PO TABS: ORAL_TABLET | ORAL | 0 refills | Status: DC

## 2024-03-16 NOTE — Telephone Encounter (Signed)
 Ok to pend 90 days on medications for me to review.

## 2024-03-16 NOTE — Telephone Encounter (Signed)
 Requesting rx rf of  ondansetron 8mg   Last written 02/22/2024 Last oV 02/22/2024 Upcoming appt 03/25/2024

## 2024-03-16 NOTE — Telephone Encounter (Signed)
 Patient friend Cloretta informed and states that they will be using a good rx card to pickup the medication at the pharmacy to reduce the cost to $25

## 2024-03-16 NOTE — Telephone Encounter (Signed)
 Spoke with [name] and advised her of the good rx discount card for his albendazole 200mg  it should be only $25 with this card. She is also asking about getting 90 day supply of all his medications tramadol  in specific it looks like everything else is already written for a year supply, because she wants them to feel safe and ease her mind she has been hearing a lot on the news and from other people that things are going to get bad with the government and that there is a possibility we  are going to lose power and other thing. Please advise thanks

## 2024-03-17 NOTE — Telephone Encounter (Signed)
 LM notify sherri that medication in for 90 day supply

## 2024-03-18 ENCOUNTER — Other Ambulatory Visit: Payer: Self-pay | Admitting: Physician Assistant

## 2024-03-18 DIAGNOSIS — F411 Generalized anxiety disorder: Secondary | ICD-10-CM

## 2024-03-18 NOTE — Telephone Encounter (Signed)
..  PDMP reviewed during this encounter. Not due for refill until 11/25 Sent to fill on 11/24

## 2024-03-25 ENCOUNTER — Encounter: Payer: Self-pay | Admitting: Physician Assistant

## 2024-03-25 ENCOUNTER — Ambulatory Visit (INDEPENDENT_AMBULATORY_CARE_PROVIDER_SITE_OTHER): Admitting: Physician Assistant

## 2024-03-25 VITALS — BP 129/70 | HR 76 | Ht 64.0 in | Wt 163.0 lb

## 2024-03-25 DIAGNOSIS — G8929 Other chronic pain: Secondary | ICD-10-CM

## 2024-03-25 DIAGNOSIS — E1159 Type 2 diabetes mellitus with other circulatory complications: Secondary | ICD-10-CM

## 2024-03-25 DIAGNOSIS — Z0289 Encounter for other administrative examinations: Secondary | ICD-10-CM

## 2024-03-25 DIAGNOSIS — I152 Hypertension secondary to endocrine disorders: Secondary | ICD-10-CM | POA: Diagnosis not present

## 2024-03-25 DIAGNOSIS — G894 Chronic pain syndrome: Secondary | ICD-10-CM

## 2024-03-25 DIAGNOSIS — M4696 Unspecified inflammatory spondylopathy, lumbar region: Secondary | ICD-10-CM

## 2024-03-25 DIAGNOSIS — M25562 Pain in left knee: Secondary | ICD-10-CM

## 2024-03-25 DIAGNOSIS — E1142 Type 2 diabetes mellitus with diabetic polyneuropathy: Secondary | ICD-10-CM

## 2024-03-25 DIAGNOSIS — M25561 Pain in right knee: Secondary | ICD-10-CM

## 2024-03-25 DIAGNOSIS — N1831 Chronic kidney disease, stage 3a: Secondary | ICD-10-CM

## 2024-03-25 DIAGNOSIS — M48062 Spinal stenosis, lumbar region with neurogenic claudication: Secondary | ICD-10-CM

## 2024-03-25 DIAGNOSIS — K580 Irritable bowel syndrome with diarrhea: Secondary | ICD-10-CM

## 2024-03-25 DIAGNOSIS — M539 Dorsopathy, unspecified: Secondary | ICD-10-CM

## 2024-03-25 DIAGNOSIS — G629 Polyneuropathy, unspecified: Secondary | ICD-10-CM

## 2024-03-25 MED ORDER — DULOXETINE HCL 60 MG PO CPEP: 60.0000 mg | ORAL_CAPSULE | Freq: Two times a day (BID) | ORAL | 3 refills | Status: AC

## 2024-03-25 MED ORDER — DICYCLOMINE HCL 10 MG PO CAPS: ORAL_CAPSULE | ORAL | 1 refills | Status: AC

## 2024-03-25 MED ORDER — DAPAGLIFLOZIN PROPANEDIOL 10 MG PO TABS: 10.0000 mg | ORAL_TABLET | Freq: Every day | ORAL | 0 refills | Status: AC

## 2024-03-25 MED ORDER — RIFAXIMIN 550 MG PO TABS: 550.0000 mg | ORAL_TABLET | Freq: Three times a day (TID) | ORAL | 0 refills | Status: AC

## 2024-03-25 MED ORDER — LISINOPRIL 10 MG PO TABS: 10.0000 mg | ORAL_TABLET | Freq: Every day | ORAL | 2 refills | Status: AC

## 2024-03-25 MED ORDER — SITAGLIPTIN PHOSPHATE 100 MG PO TABS: 100.0000 mg | ORAL_TABLET | Freq: Every day | ORAL | 0 refills | Status: AC

## 2024-03-25 NOTE — Patient Instructions (Signed)
 Stop metformin , start xifaxin for 14 days.

## 2024-03-29 ENCOUNTER — Other Ambulatory Visit (HOSPITAL_COMMUNITY): Payer: Self-pay

## 2024-03-29 ENCOUNTER — Telehealth: Payer: Self-pay

## 2024-03-29 DIAGNOSIS — K580 Irritable bowel syndrome with diarrhea: Secondary | ICD-10-CM

## 2024-03-29 NOTE — Telephone Encounter (Signed)
 Pharmacy Patient Advocate Encounter   Received notification from CoverMyMeds that prior authorization for Xifaxan  550mg  tabs is required/requested.   Insurance verification completed.   The patient is insured through [facility].   Per test claim: PA required; PA submitted to above mentioned insurance via Latent Key/confirmation #/EOC [id number] Status is pending

## 2024-03-29 NOTE — Telephone Encounter (Signed)
 Pharmacy Patient Advocate Encounter  Received notification from [employer] that Prior Authorization for Xifaxan  550mg  tabs has been APPROVED from 03/29/24 to 04/12/24. Ran test claim, Copay is $807.80. This test claim was processed through [facility]- copay amounts may vary at other pharmacies due to pharmacy/plan contracts, or as the patient moves through the different stages of their insurance plan.   PA #/Case ID/Reference #: [id number]  Per the test claim, the copay is $695.81 and $111.99 is applied to the deductible.

## 2024-03-30 NOTE — Telephone Encounter (Signed)
 Patient is aware of approval and copay. States he can not afford the medication and would like to know if something else can be called in? Please advise thanks

## 2024-03-30 NOTE — Telephone Encounter (Signed)
 Okay, I can route this to Jade and let her decide when she gets back.

## 2024-04-04 DIAGNOSIS — Z23 Encounter for immunization: Secondary | ICD-10-CM | POA: Diagnosis not present

## 2024-04-04 NOTE — Progress Notes (Signed)
 Established Patient Office Visit  Subjective   Patient ID: Benjamin Ramirez, male    DOB: November 07, 1934  Age: 89 y.o. MRN: [id number]  Chief Complaint  Patient presents with   Medical Management of Chronic Issues    HPI Discussed the use of AI scribe software for clinical note transcription with the patient, who gave verbal consent to proceed.  History of Present Illness Benjamin Ramirez is an 89 year old male with diabetes who presents for management of his blood sugar levels and chronic pain medication. He is accompanied by his sister.   Glycemic control - Fluctuating blood glucose levels, ranging from 135 to over 200 mg/dL - Elevated blood glucose after meals and during periods of stress - Uncertainty regarding exact home glucose ranges - Active home blood glucose monitoring  Antihyperglycemic medication adherence and tolerance - Currently taking metformin , but supply is limited due to change in prescription instructions - Glipizide  supply depleted as of yesterday - Januvia  (sitagliptin ) and Farxiga  not started due to lack of pharmacy fill - Suspected metformin -induced diarrhea  Gastrointestinal symptoms - Diarrhea improved minimally after discontinued metformin  use - Episode of diarrhea upon arrival at the clinic - IBS-D ongoing  Blood pressure variability - Home blood pressure readings as low as 99 mmHg  Chronic low back pain managed  - managed with cymbalta , gabapentin  and tramadol      ROS See HPi.    Objective:     BP 129/70   Pulse 76   Ht 5' 4 (1.626 m)   Wt 163 lb (73.9 kg)   SpO2 99%   BMI 27.98 kg/m  BP Readings from Last 3 Encounters:  03/25/24 129/70  02/22/24 123/66  01/01/24 126/64   Wt Readings from Last 3 Encounters:  03/25/24 163 lb (73.9 kg)  02/22/24 162 lb (73.5 kg)  01/01/24 165 lb (74.8 kg)    .Benjamin Ramirez Lab Results  Component Value Date   HGBA1C 6.2 (A) 01/01/2024     Physical Exam Constitutional:      Appearance: Normal appearance.   HENT:     Head: Normocephalic.  Cardiovascular:     Rate and Rhythm: Rhythm irregular.     Pulses: Normal pulses.     Heart sounds: Murmur heard.  Pulmonary:     Effort: Pulmonary effort is normal.     Breath sounds: Normal breath sounds.  Musculoskeletal:     Right lower leg: No edema.     Left lower leg: No edema.  Neurological:     General: No focal deficit present.     Mental Status: He is alert and oriented to person, place, and time.  Psychiatric:        Mood and Affect: Mood normal.        Assessment & Plan:  Benjamin Ramirez was seen today for medical management of chronic issues.  Diagnoses and all orders for this visit:  Controlled type 2 diabetes mellitus with diabetic polyneuropathy, without long-term current use of insulin (HCC) -     sitaGLIPtin  (JANUVIA ) 100 MG tablet; Take 1 tablet (100 mg total) by mouth daily. -     lisinopril  (ZESTRIL ) 10 MG tablet; Take 1 tablet (10 mg total) by mouth daily. -     dapagliflozin  propanediol (FARXIGA ) 10 MG TABS tablet; Take 1 tablet (10 mg total) by mouth daily.  Chronic pain syndrome  Peripheral polyneuropathy -     DULoxetine  (CYMBALTA ) 60 MG capsule; Take 1 capsule (60 mg total) by mouth 2 (two) times daily.  Chronic  pain of both knees -     DULoxetine  (CYMBALTA ) 60 MG capsule; Take 1 capsule (60 mg total) by mouth 2 (two) times daily.  Neurogenic claudication due to lumbar spinal stenosis -     DULoxetine  (CYMBALTA ) 60 MG capsule; Take 1 capsule (60 mg total) by mouth 2 (two) times daily.  Inflammatory spondylopathy of lumbar region -     DULoxetine  (CYMBALTA ) 60 MG capsule; Take 1 capsule (60 mg total) by mouth 2 (two) times daily.  Pain management contract signed -     DULoxetine  (CYMBALTA ) 60 MG capsule; Take 1 capsule (60 mg total) by mouth 2 (two) times daily.  Multilevel degenerative disc disease -     DULoxetine  (CYMBALTA ) 60 MG capsule; Take 1 capsule (60 mg total) by mouth 2 (two) times daily.  Hypertension  associated with diabetes (HCC) -     lisinopril  (ZESTRIL ) 10 MG tablet; Take 1 tablet (10 mg total) by mouth daily.  Stage 3a chronic kidney disease (HCC) -     dapagliflozin  propanediol (FARXIGA ) 10 MG TABS tablet; Take 1 tablet (10 mg total) by mouth daily.  Irritable bowel syndrome with diarrhea -     dicyclomine  (BENTYL ) 10 MG capsule; TAKE 1 CAPSULE BY MOUTH three times a day before meals. -     rifaximin  (XIFAXAN ) 550 MG TABS tablet; Take 1 tablet (550 mg total) by mouth 3 (three) times daily.   Assessment & Plan Type 2 diabetes mellitus with diabetic polyneuropathy Blood glucose levels fluctuated between 135-200 mg/dL. He was not taking sitagliptin  or Farxiga  and was out of glipizide . Metformin  caused gastrointestinal side effects, necessitating its discontinuation. Too soon for A1C.  - Discontinued metformin . - Added januvia  to glipizide  and farxiga  - Instructed to monitor blood glucose levels regularly. - Instructed to contact the office for medication issues. - BP to goal - on statin, lipitor 10mg  daily - recommend to schedule eye exam - flu shot given today  Chronic kidney disease, stage 3a Farxiga  was added to manage blood glucose and protect kidney function. - Started Farxiga  via mail order.  Irritable bowel syndrome with diarrhea Diarrhea was likely exacerbated by metformin . Xifaxan  was recommended to reduce symptoms. - Discontinued metformin . - Started Xifaxan  for 14 days, three times a day.  Chronic pain syndrome due to DDD low back - On tramadol  - Pain contract UTD - refilled cymbalta  and gabapentin      Return in about 3 months (around 06/25/2024).    Benjamin Domeier, PA-C

## 2024-04-04 NOTE — Telephone Encounter (Signed)
 Done

## 2024-04-04 NOTE — Telephone Encounter (Signed)
 Violent is aware of recommendations, agreed to GI referral

## 2024-04-04 NOTE — Addendum Note (Signed)
 Addended byBETHA DUWAINE RIGGS on: 04/04/2024 11:24 AM   Modules accepted: Orders

## 2024-04-04 NOTE — Telephone Encounter (Signed)
 There are no other comparable medications. Continue using bentyl  before meals. Stop any metformin  use. Consider referral to gastroenterology. Would you like that?

## 2024-04-04 NOTE — Addendum Note (Signed)
 Addended by: ANTONIETTE VERMELL CROME on: 04/04/2024 01:16 PM   Modules accepted: Orders

## 2024-04-04 NOTE — Addendum Note (Signed)
 Addended by: Luka Reisch A on: 04/04/2024 02:06 PM   Modules accepted: Orders

## 2024-04-05 ENCOUNTER — Ambulatory Visit: Payer: Self-pay

## 2024-04-05 NOTE — Telephone Encounter (Signed)
 FYI Only or Action Required?: FYI only for provider: transferred to CAL for scheduling - spoke with Gordy.  Patient was last seen in primary care on 03/25/2024 by Antoniette Vermell CROME, PA-C.  Called Nurse Triage reporting Leg Swelling.  Symptoms began x months ago and worsening last several weeks.  Interventions attempted: Nothing.  Symptoms are: gradually worsening.  Triage Disposition: No disposition on file.  Patient/caregiver understands and will follow disposition?:     Copied from CRM #[id number]. Topic: Clinical - Red Word Triage >> Apr 05, 2024  3:42 PM Delon DASEN wrote: Red Word that prompted transfer to Nurse Triage: Patient has fluid in both knees, hard time walking, pain level 7 Reason for Disposition  [1] MILD swelling of both ankles (i.e., pedal edema) AND [2] new-onset or getting worse  Answer Assessment - Initial Assessment Questions 1. ONSET: When did the swelling start? (e.g., minutes, hours, days)     X months and worsening 2. LOCATION: What part of the leg is swollen?  Are both legs swollen or just one leg?     Bilateral knees 3. SEVERITY: How bad is the swelling? (e.g., localized; mild, moderate, severe)   moderate 4. REDNESS: Is there redness or signs of infection?     no 5. PAIN: Is the swelling painful to touch? If Yes, ask: How painful is it?   (Scale 1-10; mild, moderate or severe)     7/10 6. FEVER: Do you have a fever? If Yes, ask: What is it, how was it measured, and when did it start?      no 7. CAUSE: What do you think is causing the leg swelling?     swelling 8. MEDICAL HISTORY: Do you have a history of blood clots (e.g., DVT), cancer, heart failure, kidney disease, or liver failure?     na 9. RECURRENT SYMPTOM: Have you had leg swelling before? If Yes, ask: When was the last time? What happened that time?     yes 10. OTHER SYMPTOMS: Do you have any other symptoms? (e.g., chest pain, difficulty breathing)       no 11.  PREGNANCY: Is there any chance you are pregnant? When was your last menstrual period?       Na  Pt having difficulty walking due to fluid build up and pain. Called office to attempt to schedule with Dr.T's replacement provider: informed to schedule with Gottswalt but NT not able to do so: call transferred to office for scheduling.  Protocols used: Leg Swelling and Edema-A-AH

## 2024-04-05 NOTE — Telephone Encounter (Signed)
 Patient hs upcoming appt 04/06/2024 with Dr. Charles.

## 2024-04-06 ENCOUNTER — Ambulatory Visit

## 2024-04-06 ENCOUNTER — Other Ambulatory Visit: Payer: Self-pay

## 2024-04-06 VITALS — BP 108/60 | Ht 64.0 in | Wt 163.0 lb

## 2024-04-06 DIAGNOSIS — M170 Bilateral primary osteoarthritis of knee: Secondary | ICD-10-CM

## 2024-04-06 DIAGNOSIS — M1711 Unilateral primary osteoarthritis, right knee: Secondary | ICD-10-CM

## 2024-04-06 DIAGNOSIS — M1712 Unilateral primary osteoarthritis, left knee: Secondary | ICD-10-CM

## 2024-04-06 NOTE — Progress Notes (Signed)
 Subjective:    Patient ID: Benjamin Ramirez, male    DOB: 89 y.o., [birth year]   MRN: [id number]  Chief Complaint: Bilateral knee pain and swelling  Discussed the use of AI scribe software for clinical note transcription with the patient, who gave verbal consent to proceed.  History of Present Illness Benjamin Ramirez is an 89 year old male with knee arthritis who presents with knee and heel pain. He was referred by [facility] for evaluation of his knee arthritis.  Knee pain and osteoarthritis - Chronic bilateral knee pain secondary to osteoarthritis, present for several years - History of prior knee surgery - Repeated joint fluid aspirations performed in the past - Received gel injections for knee pain, last administered a couple of years ago - Knee pain has progressively worsened over the past 6 months - Significant limitation of mobility due to knee pain - X-rays of both knees obtained in 2021 - Multiple prior treatments provided good but temporary relief  Heel pain - New onset of severe heel pain - Heel pain impairs walking, especially when he stops moving - Past steroid injections for heel pain provided prolonged relief - No steroid injections for heel pain in several years   Review of pertinent imaging: 4 view plain film radiographs obtained of the bilateral knees per my independent review obtained on 06/27/2019 revealing end-stage arthritic change in the medial compartment on the right knee with moderate to severe narrowing present in the medial and lateral compartment of the left knee and moderate to severe change in the lateral compartment of the right knee.  Significant osteophytic/enthesophytic change present in the quadriceps tendons bilaterally at the superior pole of the patella.  Largely well-preserved patellofemoral joint space in the right knee.  Almost complete loss of patellofemoral joint space in the left knee.    Objective:   Vitals:   04/06/24 1516  BP: 108/60    Left  knee: Visible deformity. Visible effusion. Tender to palpation over the bilateral joint lines (medial>lateral) Positive McMurray.  Right knee: Visible deformity. Visible effusion. Tender to palpation over the bilateral joint lines (medial> lateral) Positive McMurray.   Left knee Joint Synovial Fluid Aspiration with Ultrasound Guidance Shay Bartoli 1934-10-15 Indications: Pain Procedure Details Following the description of risks including infection bleeding, damage to surrounding structures, patient provided written consent for left knee joint aspiration / injection with ultrasound guidance. Patient prepped with Chloraprep. Ethyl chloride for anesthesia. 3cc of Lidocaine 1% with 0.25cc of Sodium Bicarbonate 8.4% used in wheal then injected subcutaneous fashion with 22 gauge needle on superolateral approach. Under sterile conditions, Then 4cc of Mepivicaine 2% and 1 mL of Depo-Medrol 40 mg injected. Tolerated well, decreased pain, no complications.  Right knee Joint Synovial Fluid Aspiration with Ultrasound Guidance Kinley Ferrentino 08-17-1934 Indications: Pain Procedure Details Following the description of risks including infection bleeding, damage to surrounding structures, patient provided written consent for right knee joint aspiration / injection with ultrasound guidance. Patient prepped with Chloraprep. Ethyl chloride for anesthesia. 3cc of Lidocaine 1% with 0.25cc of Sodium Bicarbonate 8.4% used in wheal then injected subcutaneous fashion with 22 gauge needle on superolateral approach. Under sterile conditions, 18 gauge needle used via lateral approach to aspirate 18 cc of serous fluid. Then 4cc of Mepivicaine 2% and 1 mL of Depo-Medrol 40 mg injected. Tolerated well, decreased pain, no complications.      Assessment & Plan:   Assessment & Plan Bilateral primary osteoarthritis of knees   Chronic bilateral knee pain due to primary  osteoarthritis has worsened over the last six months.  Previous treatments included fluid aspiration and gel injections. He currently experiences pain and fluid accumulation in the knees. Steroid injections have previously provided relief for several months. Conservative management is preferred over surgery. Today, knee effusion was drained and a steroid injection was administered. Monitor the response to the injection. If pain recurs within a month, consider alternative treatments such as platelet-rich plasma, prolotherapy, hyaluronic acid, Toradol , or genicular nerve blocks.

## 2024-04-27 ENCOUNTER — Other Ambulatory Visit: Payer: Self-pay | Admitting: Physician Assistant

## 2024-04-27 DIAGNOSIS — G8929 Other chronic pain: Secondary | ICD-10-CM

## 2024-05-04 ENCOUNTER — Other Ambulatory Visit: Payer: Self-pay | Admitting: Physician Assistant

## 2024-05-04 DIAGNOSIS — G8929 Other chronic pain: Secondary | ICD-10-CM

## 2024-05-04 MED ORDER — TRAMADOL HCL 50 MG PO TABS: ORAL_TABLET | ORAL | 0 refills | Status: AC

## 2024-05-04 NOTE — Telephone Encounter (Signed)
 Copied from CRM [id number]. Topic: Clinical - Medication Refill >> May 04, 2024  2:05 PM Delon T wrote: Medication: traMADol  (ULTRAM ) 50 MG tablet  Has the patient contacted their pharmacy? Yes (Agent: If no, request that the patient contact the pharmacy for the refill. If patient does not wish to contact the pharmacy document the reason why and proceed with request.) (Agent: If yes, when and what did the pharmacy advise?)  This is the patient's preferred pharmacy:    [facility] #[id number] - [place], [place] - [address] AT [facility] OF [address] [place] [address] Phone: [phone] Fax: [phone]  Is this the correct pharmacy for this prescription? Yes If no, delete pharmacy and type the correct one.   Has the prescription been filled recently? Yes  Is the patient out of the medication? Yes  Has the patient been seen for an appointment in the last year OR does the patient have an upcoming appointment? Yes  Can we respond through MyChart? Yes  Agent: Please be advised that Rx refills may take up to 3 business days. We ask that you follow-up with your pharmacy.

## 2024-05-04 NOTE — Telephone Encounter (Signed)
 Forwarding this to Zada Palin, NP covering for Vermell Bologna, [place]  In checking patient chart   Shows that  pt has refills at [facility] pharmacy and was told that this could be filled now and she would get the [facility] ready for patient pick up.   Spoke with patient caregiver violet  and was told that the patient can not use the mfg that Organ  uses causes gagging  and he can not use this mfg at all- they do not have any other availability at [facility].   [facility] pharmacy and cancelled the refills there.   Requesting that the prescriptions be resent to [facility] main [place]. States he has used their mfg in past without problem. This is where he usually gets the prescriptions filled.

## 2024-05-10 MED ORDER — METHYLPREDNISOLONE ACETATE 40 MG/ML IJ SUSP
40.0000 mg | Freq: Once | INTRAMUSCULAR | Status: AC
  Administered 2024-04-06: 40 mg via INTRA_ARTICULAR

## 2024-05-10 NOTE — Addendum Note (Signed)
 Addended by: MARCINE HARLENE SAILOR on: 05/10/2024 10:15 AM   Modules accepted: Orders

## 2024-06-10 ENCOUNTER — Other Ambulatory Visit: Payer: Self-pay | Admitting: Physician Assistant

## 2024-06-10 DIAGNOSIS — F411 Generalized anxiety disorder: Secondary | ICD-10-CM

## 2024-07-01 ENCOUNTER — Ambulatory Visit: Admitting: Physician Assistant
